# Patient Record
Sex: Female | Born: 1960 | Race: White | Hispanic: No | Marital: Married | Smoking: Never smoker
Health system: Southern US, Community
[De-identification: ages and names within clinical notes are randomized; demographics above are authoritative.]

## PROBLEM LIST (undated history)

## (undated) DIAGNOSIS — M1990 Unspecified osteoarthritis, unspecified site: Secondary | ICD-10-CM

## (undated) DIAGNOSIS — I10 Essential (primary) hypertension: Secondary | ICD-10-CM

## (undated) HISTORY — PX: TONSILLECTOMY: SUR1361

## (undated) HISTORY — DX: Unspecified osteoarthritis, unspecified site: M19.90

---

## 2005-11-08 HISTORY — PX: CERVICAL ABLATION: SHX5771

## 2015-04-03 ENCOUNTER — Other Ambulatory Visit: Payer: Self-pay

## 2015-04-03 ENCOUNTER — Emergency Department
Admission: EM | Admit: 2015-04-03 | Discharge: 2015-04-03 | Disposition: A | Discharge status: Home / Self Care | Payer: BLUE CROSS/BLUE SHIELD | Attending: Emergency Medicine | Admitting: Emergency Medicine

## 2015-04-03 DIAGNOSIS — R51 Headache: Secondary | ICD-10-CM | POA: Insufficient documentation

## 2015-04-03 DIAGNOSIS — Z79899 Other long term (current) drug therapy: Secondary | ICD-10-CM | POA: Diagnosis not present

## 2015-04-03 DIAGNOSIS — I10 Essential (primary) hypertension: Secondary | ICD-10-CM | POA: Diagnosis not present

## 2015-04-03 DIAGNOSIS — R040 Epistaxis: Secondary | ICD-10-CM

## 2015-04-03 LAB — BASIC METABOLIC PANEL
Anion gap: 8 (ref 5–15)
BUN: 13 mg/dL (ref 6–20)
CHLORIDE: 104 mmol/L (ref 101–111)
CO2: 29 mmol/L (ref 22–32)
Calcium: 9.6 mg/dL (ref 8.9–10.3)
Creatinine, Ser: 0.67 mg/dL (ref 0.44–1.00)
GFR calc Af Amer: >60 mL/min (ref >60)
GFR calc non Af Amer: >60 mL/min (ref >60)
Glucose, Bld: 96 mg/dL (ref 65–99)
Potassium: 3.9 mmol/L (ref 3.5–5.1)
SODIUM: 141 mmol/L (ref 135–145)

## 2015-04-03 LAB — CBC
HCT: 40.1 % (ref 35.0–47.0)
Hemoglobin: 13.4 g/dL (ref 12.0–16.0)
MCH: 30.6 pg (ref 26.0–34.0)
MCHC: 33.4 g/dL (ref 32.0–36.0)
MCV: 91.7 fL (ref 80.0–100.0)
PLATELETS: 260 10*3/uL (ref 150–440)
RBC: 4.37 MIL/uL (ref 3.80–5.20)
RDW: 13 % (ref 11.5–14.5)
WBC: 5.2 10*3/uL (ref 3.6–11.0)

## 2015-04-03 LAB — PROTIME-INR
INR: 0.98
Prothrombin Time: 13.2 seconds (ref 11.4–15.0)

## 2015-04-03 LAB — TROPONIN I

## 2015-04-03 MED ORDER — HYDROCHLOROTHIAZIDE 25 MG PO TABS: 25.0000 mg | ORAL_TABLET | Freq: Every day | ORAL | Status: AC

## 2015-04-03 MED ORDER — HYDROCHLOROTHIAZIDE 25 MG PO TABS: 25.0000 mg | ORAL_TABLET | Freq: Every day | ORAL | Status: DC

## 2015-04-03 MED ORDER — HYDROCHLOROTHIAZIDE 12.5 MG PO CAPS
ORAL_CAPSULE | ORAL | Status: AC
  Administered 2015-04-03: 25 mg via ORAL
  Filled 2015-04-03: qty 2

## 2015-04-03 NOTE — ED Provider Notes (Signed)
EKG: Normal sinus rhythm normal axis normal intervals no evidence of her partially or acute infarction, rate is 80.   Alexis Newport, MD 04/03/15 [phone]

## 2015-04-03 NOTE — ED Notes (Addendum)
Patient with a hx of high bp. Patient has been off htn meds for approx 18 months due to insurance reasons. States that she has been having nose bleeds so she checked her BP at home. 188/99 & 184/101 at home. 178/116 at urgent care. Denies headache or blurred vision at this time. Denies experiencing any chest pain or shortness of breath

## 2015-04-03 NOTE — ED Provider Notes (Signed)
[facility] Emergency Department Provider Note  ____________________________________________  Time seen: Approximately 2:53 PM  I have reviewed the triage vital signs and the nursing notes.   HISTORY  Chief Complaint Hypertension   HPI Alexis Ruiz is a 54 y.o. female presents here for concern of high blood pressure. Patient states that she has a history of high blood pressure however 18 months ago stopped taking medications because of insurance issues. States this morning she had a mild headache and she checked her blood pressure at home and that blood pressure was 188/99. Patient states that she has an appointment with her primary care physician tomorrow for follow-up but stated that she wanted to go ahead and have her blood pressure checked. Reports in the past she took 12.5 mg HCTZ daily which worked well for her and controlled her blood pressure. Presents to the ER with a request every starting blood pressure medication. Denies pain or complaints at this time.  States over the last 1-2 weeks she has had a total of 3 nosebleeds. Patient states that these are nontraumatic however noticed that a small amount of blood but quickly resolved with direct pressure. Patient states she has also had some congestion with increased sneezing and blowing of her nose. States this afternoon blowing her nose she had a brief nose bleed (less than 5 minutes) from left nare, that resolved after direct pressure. Patient reports occasional headaches. Denies consistent headaches. Reports some increased stress recently.   Denies chest pain or shortness of breath. Denies dizziness, vision changes, headache, injury, nausea, vomiting, diarrhea, abdominal pain.     No past medical history on file.  There are no active problems to display for this patient.   No past surgical history on file.  No current outpatient prescriptions on file.  Allergies Review of patient's allergies  indicates no known allergies.  No family history on file.  Social History History  Substance Use Topics  . Smoking status: Not on file  . Smokeless tobacco: Not on file  . Alcohol Use: Not on file    Review of Systems Constitutional: No fever/chills Eyes: No visual changes. ENT: No sore throat. Nose bleeds as above.  Cardiovascular: Denies chest pain. Respiratory: Denies shortness of breath. Gastrointestinal: No abdominal pain.  No nausea, no vomiting.  No diarrhea.  No constipation. Genitourinary: Negative for dysuria. Musculoskeletal: Negative for back pain. Skin: Negative for rash. Neurological: Negative for headaches, focal weakness or numbness.  10-point ROS otherwise negative.  ____________________________________________   PHYSICAL EXAM:  VITAL SIGNS: ED Triage Vitals  Enc Vitals Group     BP 04/03/15 1351 176/107 mmHg     Pulse Rate 04/03/15 1351 85     Resp 04/03/15 1351 16     Temp 04/03/15 1351 98.3 F (36.8 C)     Temp Source 04/03/15 1351 Oral     SpO2 04/03/15 1351 100 %     Weight 04/03/15 1351 191 lb (86.637 kg)     Height 04/03/15 1351 5\' 8"  (1.727 m)     Head Cir --      Peak Flow --      Pain Score --      Pain Loc --      Pain Edu? --      Excl. in [place]? --   Blood pressure 150/87, pulse 72, temperature 98.3 F (36.8 C), temperature source Oral, resp. rate 16, height 5\' 8"  (1.727 m), weight 191 lb (86.637 kg), SpO2 100 %.  Constitutional:  Alert and oriented. Well appearing and in no acute distress. Eyes: Conjunctivae are normal. PERRL. EOMI. Head: Atraumatic. Nose: No congestion/rhinnorhea. Left nare with anterior scant amount dried blood. No active bleeding. Nontender. Bilateral nares patent. No other abnormality visualized.  Mouth/Throat: Mucous membranes are moist.  Oropharynx non-erythematous. Neck: No stridor.  No cervical spine tenderness to palpation. Hematological/Lymphatic/Immunilogical: No cervical lymphadenopathy. Cardiovascular:  Normal rate, regular rhythm. Grossly normal heart sounds.  Good peripheral circulation. Respiratory: Normal respiratory effort.  No retractions. Lungs CTAB. Gastrointestinal: Soft and nontender. No distention. No abdominal bruits. No CVA tenderness. Musculoskeletal: No lower extremity tenderness nor edema.  No joint effusions. Neurologic:  Normal speech and language. No gross focal neurologic deficits are appreciated. Speech is normal. No gait instability. Skin:  Skin is warm, dry and intact. No rash noted. Psychiatric: Mood and affect are normal. Speech and behavior are normal.  ____________________________________________   LABS (all labs ordered are listed, but only abnormal results are displayed)  Labs Reviewed  CBC  BASIC METABOLIC PANEL  TROPONIN I  PROTIME-INR   ____________________________________________  EKG  See Dr Jimmye Norman interpretation ____________________________________________   ____________________________________________   INITIAL IMPRESSION / ASSESSMENT AND PLAN / ED COURSE  Pertinent labs & imaging results that were available during my care of the patient were reviewed by me and considered in my medical decision making (see chart for details).    no acute distress. Very well-appearing patient. Denies complaints at this time. However reports for evaluation of blood pressure and for the request of restarting her blood pressure medication. Patient with history of high blood pressure that was managed with 12.5 mg of HCTZ. Patient has follow-up with her primary care physician tomorrow. Will start patient on 25 mg of daily HCTZ and for patient to have close follow-up with primary care physician. EKG in the ER was normal sinus rhythm. Lab work unremarkable including a negative troponin as well as normal range PT/INR. Suspect intermittent nosebleeds related to recent sinus irritation with increased sneezing with elevated blood pressure being possible contributing  factor.  Patient follow up primary care physician tomorrow as scheduled. Blood pressure improved in ER.  Discussed very strict follow-up and return parameters. Patient and spouse agree to plan. ____________________________________________   FINAL CLINICAL IMPRESSION(S) / ED DIAGNOSES  Final diagnoses:  Essential hypertension  Epistaxis  Resolved epistaxis   Marylene Land, NP 04/03/15 Overton Yao, MD 04/05/15 [phone]

## 2015-04-03 NOTE — Discharge Instructions (Signed)
Take medication as prescribed. Eat a healthy diet. Avoid increased sinus irritation.Rest.   Follow-up with the primary care physician tomorrow as scheduled.  Return to the ER immediately for chest pain, shortness of breath, dizziness, headaches, vision changes, new or worsening concerns.  Hypertension Hypertension, commonly called high blood pressure, is when the force of blood pumping through your arteries is too strong. Your arteries are the blood vessels that carry blood from your heart throughout your body. A blood pressure reading consists of a higher number over a lower number, such as 110/72. The higher number (systolic) is the pressure inside your arteries when your heart pumps. The lower number (diastolic) is the pressure inside your arteries when your heart relaxes. Ideally you want your blood pressure below 120/80. Hypertension forces your heart to work harder to pump blood. Your arteries may become narrow or stiff. Having hypertension puts you at risk for heart disease, stroke, and other problems.  RISK FACTORS Some risk factors for high blood pressure are controllable. Others are not.  Risk factors you cannot control include:   Race. You may be at higher risk if you are African American.  Age. Risk increases with age.  Gender. Men are at higher risk than women before age 19 years. After age 24, women are at higher risk than men. Risk factors you can control include:  Not getting enough exercise or physical activity.  Being overweight.  Getting too much fat, sugar, calories, or salt in your diet.  Drinking too much alcohol. SIGNS AND SYMPTOMS Hypertension does not usually cause signs or symptoms. Extremely high blood pressure (hypertensive crisis) may cause headache, anxiety, shortness of breath, and nosebleed. DIAGNOSIS  To check if you have hypertension, your health care provider will measure your blood pressure while you are seated, with your arm held at the level of your  heart. It should be measured at least twice using the same arm. Certain conditions can cause a difference in blood pressure between your right and left arms. A blood pressure reading that is higher than normal on one occasion does not mean that you need treatment. If one blood pressure reading is high, ask your health care provider about having it checked again. TREATMENT  Treating high blood pressure includes making lifestyle changes and possibly taking medicine. Living a healthy lifestyle can help lower high blood pressure. You may need to change some of your habits. Lifestyle changes may include:  Following the DASH diet. This diet is high in fruits, vegetables, and whole grains. It is low in salt, red meat, and added sugars.  Getting at least 2 hours of brisk physical activity every week.  Losing weight if necessary.  Not smoking.  Limiting alcoholic beverages.  Learning ways to reduce stress. If lifestyle changes are not enough to get your blood pressure under control, your health care provider may prescribe medicine. You may need to take more than one. Work closely with your health care provider to understand the risks and benefits. HOME CARE INSTRUCTIONS  Have your blood pressure rechecked as directed by your health care provider.   Take medicines only as directed by your health care provider. Follow the directions carefully. Blood pressure medicines must be taken as prescribed. The medicine does not work as well when you skip doses. Skipping doses also puts you at risk for problems.   Do not smoke.   Monitor your blood pressure at home as directed by your health care provider. SEEK MEDICAL CARE IF:   You  think you are having a reaction to medicines taken.  You have recurrent headaches or feel dizzy.  You have swelling in your ankles.  You have trouble with your vision. SEEK IMMEDIATE MEDICAL CARE IF:  You develop a severe headache or confusion.  You have unusual  weakness, numbness, or feel faint.  You have severe chest or abdominal pain.  You vomit repeatedly.  You have trouble breathing. MAKE SURE YOU:   Understand these instructions.  Will watch your condition.  Will get help right away if you are not doing well or get worse. Document Released: 10/25/2005 Document Revised: 03/11/2014 Document Reviewed: 08/17/2013 [facility] Patient Information 2015 [place], [place]. This information is not intended to replace advice given to you by your health care provider. Make sure you discuss any questions you have with your health care provider.

## 2015-05-06 ENCOUNTER — Other Ambulatory Visit: Payer: Self-pay | Admitting: Family Medicine

## 2015-05-06 DIAGNOSIS — Z1231 Encounter for screening mammogram for malignant neoplasm of breast: Secondary | ICD-10-CM

## 2015-06-12 ENCOUNTER — Other Ambulatory Visit: Payer: Self-pay | Admitting: Family Medicine

## 2015-06-12 DIAGNOSIS — M5442 Lumbago with sciatica, left side: Secondary | ICD-10-CM

## 2015-06-19 ENCOUNTER — Ambulatory Visit: Payer: BLUE CROSS/BLUE SHIELD

## 2015-08-28 HISTORY — PX: LUMBAR LAMINECTOMY: SHX95

## 2016-05-04 ENCOUNTER — Other Ambulatory Visit: Payer: Self-pay | Admitting: Family Medicine

## 2016-05-04 DIAGNOSIS — Z1231 Encounter for screening mammogram for malignant neoplasm of breast: Secondary | ICD-10-CM

## 2016-06-11 ENCOUNTER — Other Ambulatory Visit: Payer: Self-pay | Admitting: Family Medicine

## 2016-06-11 ENCOUNTER — Ambulatory Visit
Admission: RE | Admit: 2016-06-11 | Discharge: 2016-06-11 | Disposition: A | Discharge status: Home / Self Care | Payer: BLUE CROSS/BLUE SHIELD | Source: Ambulatory Visit | Attending: Family Medicine | Admitting: Family Medicine

## 2016-06-11 DIAGNOSIS — Z1231 Encounter for screening mammogram for malignant neoplasm of breast: Secondary | ICD-10-CM

## 2016-08-06 DIAGNOSIS — K8050 Calculus of bile duct without cholangitis or cholecystitis without obstruction: Secondary | ICD-10-CM | POA: Diagnosis not present

## 2016-08-06 DIAGNOSIS — R1013 Epigastric pain: Secondary | ICD-10-CM | POA: Diagnosis present

## 2016-08-06 DIAGNOSIS — K859 Acute pancreatitis without necrosis or infection, unspecified: Secondary | ICD-10-CM | POA: Diagnosis not present

## 2016-08-06 DIAGNOSIS — I10 Essential (primary) hypertension: Secondary | ICD-10-CM | POA: Diagnosis not present

## 2016-08-06 LAB — COMPREHENSIVE METABOLIC PANEL
ALBUMIN: 4.4 g/dL (ref 3.5–5.0)
ALK PHOS: 65 U/L (ref 38–126)
ALT: 45 U/L (ref 14–54)
AST: 95 U/L — ABNORMAL HIGH (ref 15–41)
Anion gap: 3 — ABNORMAL LOW (ref 5–15)
BUN: 26 mg/dL — ABNORMAL HIGH (ref 6–20)
CALCIUM: 9.6 mg/dL (ref 8.9–10.3)
CO2: 33 mmol/L — ABNORMAL HIGH (ref 22–32)
CREATININE: 0.87 mg/dL (ref 0.44–1.00)
Chloride: 105 mmol/L (ref 101–111)
GFR calc Af Amer: >60 mL/min (ref >60)
GFR calc non Af Amer: >60 mL/min (ref >60)
GLUCOSE: 104 mg/dL — AB (ref 65–99)
Potassium: 3.5 mmol/L (ref 3.5–5.1)
SODIUM: 141 mmol/L (ref 135–145)
Total Bilirubin: 0.6 mg/dL (ref 0.3–1.2)
Total Protein: 7.6 g/dL (ref 6.5–8.1)

## 2016-08-06 LAB — CBC
HCT: 37.2 % (ref 35.0–47.0)
HEMOGLOBIN: 12.9 g/dL (ref 12.0–16.0)
MCH: 31 pg (ref 26.0–34.0)
MCHC: 34.6 g/dL (ref 32.0–36.0)
MCV: 89.5 fL (ref 80.0–100.0)
PLATELETS: 240 10*3/uL (ref 150–440)
RBC: 4.15 MIL/uL (ref 3.80–5.20)
RDW: 13.4 % (ref 11.5–14.5)
WBC: 5.4 10*3/uL (ref 3.6–11.0)

## 2016-08-06 LAB — LIPASE, BLOOD: Lipase: 197 U/L — ABNORMAL HIGH (ref 11–51)

## 2016-08-06 NOTE — ED Triage Notes (Signed)
Patient states "I think I might have had a gallbladder attack."  Patient reports she had eaten shortly prior to pain starting.  Reports pain was epigastric and radiated to her back.

## 2016-08-07 ENCOUNTER — Encounter: Payer: Self-pay | Admitting: Emergency Medicine

## 2016-08-07 ENCOUNTER — Emergency Department: Payer: BLUE CROSS/BLUE SHIELD

## 2016-08-07 ENCOUNTER — Emergency Department
Admission: EM | Admit: 2016-08-07 | Discharge: 2016-08-07 | Disposition: A | Discharge status: Home / Self Care | Payer: BLUE CROSS/BLUE SHIELD | Attending: Emergency Medicine | Admitting: Emergency Medicine

## 2016-08-07 DIAGNOSIS — K859 Acute pancreatitis, unspecified: Secondary | ICD-10-CM

## 2016-08-07 DIAGNOSIS — K8050 Calculus of bile duct without cholangitis or cholecystitis without obstruction: Secondary | ICD-10-CM

## 2016-08-07 HISTORY — DX: Essential (primary) hypertension: I10

## 2016-08-07 LAB — URINALYSIS COMPLETE WITH MICROSCOPIC (ARMC ONLY)
BILIRUBIN URINE: NEGATIVE
Bacteria, UA: NONE SEEN
GLUCOSE, UA: NEGATIVE mg/dL
HGB URINE DIPSTICK: NEGATIVE
KETONES UR: NEGATIVE mg/dL
LEUKOCYTES UA: NEGATIVE
Nitrite: NEGATIVE
Protein, ur: NEGATIVE mg/dL
SQUAMOUS EPITHELIAL / LPF: NONE SEEN
Specific Gravity, Urine: 1.025 (ref 1.005–1.030)
pH: 6 (ref 5.0–8.0)

## 2016-08-07 MED ORDER — HYDROCODONE-ACETAMINOPHEN 5-325 MG PO TABS: 1.0000 | ORAL_TABLET | ORAL | 0 refills | Status: DC | PRN

## 2016-08-07 MED ORDER — DOCUSATE SODIUM 100 MG PO CAPS: ORAL_CAPSULE | ORAL | 0 refills | Status: AC

## 2016-08-07 MED ORDER — ONDANSETRON 4 MG PO TBDP: ORAL_TABLET | ORAL | 0 refills | Status: DC

## 2016-08-07 NOTE — Discharge Instructions (Signed)
You have been seen in the Emergency Department (ED) for abdominal pain.  Your evaluation suggests that your pain is caused by gallstones.  Fortunately you do not need immediate surgery at this time, but it is important that you follow up with a surgeon as an outpatient; typically surgical removal of the gallbladder is the only thing that will definitively fix your issue.  Read through the included information about a bland diet, and use any prescribed medications as instructed.  Avoid smoking and alcohol use.  Of note, your lipase was elevated slightly today which indicates that he may have a mild degree of pancreatitis, but given that you are not having nausea, vomiting, or persistent abdominal pain, it is reasonable to go home and follow-up as an outpatient.  Please do read through all the included information, however, for additional management recommendations.  Avoid spicy and fatty foods.  Please follow up as instructed above regarding today?s emergent visit and the symptoms that are bothering you.  Take Norco as prescribed for pain. Do not drink alcohol, drive or participate in any other potentially dangerous activities while taking this medication as it may make you sleepy. Do not take this medication with any other sedating medications, either prescription or over-the-counter. If you were prescribed Percocet or Vicodin, do not take these with acetaminophen (Tylenol) as it is already contained within these medications.   This medication is an opiate (or narcotic) pain medication and can be habit forming.  Use it as little as possible to achieve adequate pain control.  Do not use or use it with extreme caution if you have a history of opiate abuse or dependence.  If you are on a pain contract with your primary care doctor or a pain specialist, be sure to let them know you were prescribed this medication today from the [facility].  This medication is intended for your use only  - do not give any to anyone else and keep it in a secure place where nobody else, especially children, have access to it.  It will also cause or worsen constipation, so you may want to consider taking an over-the-counter stool softener while you are taking this medication.  Return to the ED if your abdominal pain worsens or fails to improve, you develop bloody vomiting, bloody diarrhea, you are unable to tolerate fluids due to vomiting, fever greater than 101, or other symptoms that concern you.

## 2016-08-07 NOTE — ED Provider Notes (Signed)
[facility] Emergency Department Provider Note  ____________________________________________   First MD Initiated Contact with Patient 08/07/16 0100     (approximate)  I have reviewed the triage vital signs and the nursing notes.   HISTORY  Chief Complaint Abdominal Pain    HPI Alexis Ruiz is a 55 y.o. female who presents for evaluation of an episode of severe epigastric pain radiating through to her back that occurred about 30 minutes after she ate a spicy dinner.  She reports that she has never had a diagnosis of gallstones in the past but all of her siblings have and this felt just like what she was told there symptoms felt like.  She was asymptomatic but then had acute onset of severe sharp and burning pain in her epigastrium and right upper quadrant but nothing made better or worse but it only lasted about 15-20 minutes.  It was accompanied with nausea but no vomiting.  She denies fever/chills, chest pain, shortness of breath, dysuria.He is currently completely asymptomatic.  She reports that she does not smoke.  She drinks about 4-5 times a week typically only 1 glass of wine or one cocktail at a time.  She did have some alcohol earlier tonight, just 1 drink.  She has not had any new medications recently.   Past Medical History:  Diagnosis Date  . Hypertension     There are no active problems to display for this patient.   History reviewed. No pertinent surgical history.  Prior to Admission medications   Medication Sig Start Date End Date Taking? Authorizing Provider  docusate sodium (COLACE) 100 MG capsule Take 1 tablet once or twice daily as needed for constipation while taking narcotic pain medicine 08/07/16   Hinda Kehr, MD  hydrochlorothiazide (HYDRODIURIL) 25 MG tablet Take 1 tablet (25 mg total) by mouth daily. 04/03/15   Marylene Land, NP  HYDROcodone-acetaminophen (NORCO/VICODIN) 5-325 MG tablet Take 1-2 tablets by mouth every 4  (four) hours as needed for moderate pain. 08/07/16   Hinda Kehr, MD  ondansetron (ZOFRAN ODT) 4 MG disintegrating tablet Allow 1-2 tablets to dissolve in your mouth every 8 hours as needed for nausea/vomiting 08/07/16   Hinda Kehr, MD    Allergies Review of patient's allergies indicates no known allergies.  Family History  Problem Relation Age of Onset  . Breast cancer Neg Hx     Social History Social History  Substance Use Topics  . Smoking status: Never Smoker  . Smokeless tobacco: Never Used  . Alcohol use 2.4 oz/week    4 Glasses of wine per week    Review of Systems Constitutional: No fever/chills Eyes: No visual changes. ENT: No sore throat. Cardiovascular: Denies chest pain. Respiratory: Denies shortness of breath. Gastrointestinal: Right upper quadrant and epigastric pain with nausea, no vomiting, now resolved Genitourinary: Negative for dysuria. Musculoskeletal: Negative for back pain. Skin: Negative for rash. Neurological: Negative for headaches, focal weakness or numbness.  10-point ROS otherwise negative.  ____________________________________________   PHYSICAL EXAM:  VITAL SIGNS: ED Triage Vitals [08/06/16 2106]  Enc Vitals Group     BP (!) 162/87     Pulse Rate 77     Resp 20     Temp 98.1 F (36.7 C)     Temp Source Oral     SpO2 100 %     Weight 195 lb (88.5 kg)     Height 5\' 8"  (1.727 m)     Head Circumference  Peak Flow      Pain Score      Pain Loc      Pain Edu?      Excl. in [place]?     Constitutional: Alert and oriented. Well appearing and in no acute distress. Eyes: Conjunctivae are normal. PERRL. EOMI. Head: Atraumatic. Nose: No congestion/rhinnorhea. Mouth/Throat: Mucous membranes are moist.  Oropharynx non-erythematous. Neck: No stridor.  No meningeal signs.   Cardiovascular: Normal rate, regular rhythm. Good peripheral circulation. Grossly normal heart sounds. Respiratory: Normal respiratory effort.  No retractions. Lungs  CTAB. Gastrointestinal: Soft With minimal tenderness to palpation of the right upper quadrant, no significant Murphy sign, no right lower or left lower quadrant tenderness.  Down or guarding. Musculoskeletal: No lower extremity tenderness nor edema. No gross deformities of extremities. Neurologic:  Normal speech and language. No gross focal neurologic deficits are appreciated.  Skin:  Skin is warm, dry and intact. No rash noted. Psychiatric: Mood and affect are normal. Speech and behavior are normal.  ____________________________________________   LABS (all labs ordered are listed, but only abnormal results are displayed)  Labs Reviewed  LIPASE, BLOOD - Abnormal; Notable for the following:       Result Value   Lipase 197 (*)    All other components within normal limits  COMPREHENSIVE METABOLIC PANEL - Abnormal; Notable for the following:    CO2 33 (*)    Glucose, Bld 104 (*)    BUN 26 (*)    AST 95 (*)    Anion gap 3 (*)    All other components within normal limits  URINALYSIS COMPLETEWITH MICROSCOPIC ([facility] ONLY) - Abnormal; Notable for the following:    Color, Urine YELLOW (*)    APPearance CLEAR (*)    All other components within normal limits  CBC   ____________________________________________  EKG  ED ECG REPORT I, Mackson Botz, the attending physician, personally viewed and interpreted this ECG.  Date: 08/07/2016 EKG Time: 00:46 Rate: 72 Rhythm: normal sinus rhythm QRS Axis: normal Intervals: normal ST/T Wave abnormalities: normal Conduction Disturbances: none Narrative Interpretation: RSR' pattern, Non-specific ST segment / T-wave changes, but no evidence of acute ischemia.  ____________________________________________  RADIOLOGY   US Abdomen Limited Ruq  Result Date: 08/07/2016 CLINICAL DATA:  Acute onset of severe right upper quadrant abdominal pain and epigastric pain. Elevated lipase. Initial encounter. EXAM: US ABDOMEN LIMITED - RIGHT UPPER QUADRANT  COMPARISON:  None. FINDINGS: Gallbladder: Stones and sludge are noted filling the gallbladder. Stones measure up to 3.1 cm in size. No gallbladder wall thickening or pericholecystic fluid is seen. No ultrasonographic Murphy's sign is elicited. Common bile duct: Diameter: 0.4 cm, within normal limits in caliber. Liver: No focal lesion identified. Within normal limits in parenchymal echogenicity. IMPRESSION: Stones and sludge noted filling the gallbladder. No evidence for obstruction or cholecystitis. Electronically Signed   By: Garald Balding M.D.   On: 08/07/2016 03:09    ____________________________________________   PROCEDURES  Procedure(s) performed:   Procedures   Critical Care performed: No ____________________________________________   INITIAL IMPRESSION / ASSESSMENT AND PLAN / ED COURSE  Pertinent labs & imaging results that were available during my care of the patient were reviewed by me and considered in my medical decision making (see chart for details).  Liver enzymes are unremarkable but she does have an elevation of her lipase.  However at this point she is completely asymptomatic.  Choledocholithiasis is the most likely culprit.  We will evaluate with an ultrasound.  Given the description  of the symptoms we must also consider ACS or even aortic pathology, but given the elevated lipase I believe they choledocholithiasis is much more likely and we will investigate further with ultrasound.   Clinical Course  Value Comment By Time  US Abdomen Limited RUQ Sludge and gallstones but no evidence of cholecystitis or choledocholithiasis with a normal caliber common bile duct.  The patient remains asymptomatic, very minimally tender to palpation, no nausea and no vomiting.  She is comfortable with the plan to go home and follow-up as an outpatient.  I am giving her information about both pancreatitis and biliary disease and I gave her strict return precautions if she were to develop a  fever or more serious or severe symptoms.  She understands and agrees with the plan. Hinda Kehr, MD 09/30 [id number]    ____________________________________________  FINAL CLINICAL IMPRESSION(S) / ED DIAGNOSES  Final diagnoses:  Biliary colic  Acute pancreatitis, unspecified pancreatitis type     MEDICATIONS GIVEN DURING THIS VISIT:  Medications - No data to display   NEW OUTPATIENT MEDICATIONS STARTED DURING THIS VISIT:  New Prescriptions   DOCUSATE SODIUM (COLACE) 100 MG CAPSULE    Take 1 tablet once or twice daily as needed for constipation while taking narcotic pain medicine   HYDROCODONE-ACETAMINOPHEN (NORCO/VICODIN) 5-325 MG TABLET    Take 1-2 tablets by mouth every 4 (four) hours as needed for moderate pain.   ONDANSETRON (ZOFRAN ODT) 4 MG DISINTEGRATING TABLET    Allow 1-2 tablets to dissolve in your mouth every 8 hours as needed for nausea/vomiting    Modified Medications   No medications on file    Discontinued Medications   No medications on file     Note:  This document was prepared using Dragon voice recognition software and may include unintentional dictation errors.    Hinda Kehr, MD 08/07/16 [phone]

## 2016-08-10 ENCOUNTER — Ambulatory Visit: Payer: Self-pay | Admitting: Surgery

## 2016-08-10 ENCOUNTER — Ambulatory Visit (INDEPENDENT_AMBULATORY_CARE_PROVIDER_SITE_OTHER): Payer: BLUE CROSS/BLUE SHIELD | Admitting: Surgery

## 2016-08-10 ENCOUNTER — Other Ambulatory Visit
Admission: RE | Admit: 2016-08-10 | Discharge: 2016-08-10 | Disposition: A | Discharge status: Home / Self Care | Payer: BLUE CROSS/BLUE SHIELD | Source: Ambulatory Visit | Attending: Surgery | Admitting: Surgery

## 2016-08-10 ENCOUNTER — Encounter: Payer: Self-pay | Admitting: Surgery

## 2016-08-10 VITALS — BP 160/92 | HR 66 | Temp 98.5°F | Wt 194.0 lb

## 2016-08-10 DIAGNOSIS — K8020 Calculus of gallbladder without cholecystitis without obstruction: Secondary | ICD-10-CM

## 2016-08-10 DIAGNOSIS — R1013 Epigastric pain: Secondary | ICD-10-CM | POA: Diagnosis not present

## 2016-08-10 LAB — COMPREHENSIVE METABOLIC PANEL
ALT: 22 U/L (ref 14–54)
AST: 24 U/L (ref 15–41)
Albumin: 4.7 g/dL (ref 3.5–5.0)
Alkaline Phosphatase: 63 U/L (ref 38–126)
Anion gap: 9 (ref 5–15)
BILIRUBIN TOTAL: 0.5 mg/dL (ref 0.3–1.2)
BUN: 19 mg/dL (ref 6–20)
CHLORIDE: 98 mmol/L — AB (ref 101–111)
CO2: 29 mmol/L (ref 22–32)
CREATININE: 0.71 mg/dL (ref 0.44–1.00)
Calcium: 9.5 mg/dL (ref 8.9–10.3)
GFR calc Af Amer: >60 mL/min (ref >60)
GLUCOSE: 96 mg/dL (ref 65–99)
POTASSIUM: 4 mmol/L (ref 3.5–5.1)
Sodium: 136 mmol/L (ref 135–145)
Total Protein: 7.6 g/dL (ref 6.5–8.1)

## 2016-08-10 LAB — CBC WITH DIFFERENTIAL/PLATELET
Basophils Absolute: 0 10*3/uL (ref 0–0.1)
Basophils Relative: 1 %
Eosinophils Absolute: 0.2 10*3/uL (ref 0–0.7)
Eosinophils Relative: 5 %
HEMATOCRIT: 40.8 % (ref 35.0–47.0)
Hemoglobin: 13.6 g/dL (ref 12.0–16.0)
LYMPHS PCT: 23 %
Lymphs Abs: 1.2 10*3/uL (ref 1.0–3.6)
MCH: 30 pg (ref 26.0–34.0)
MCHC: 33.2 g/dL (ref 32.0–36.0)
MCV: 90.3 fL (ref 80.0–100.0)
Monocytes Absolute: 0.4 10*3/uL (ref 0.2–0.9)
Monocytes Relative: 7 %
Neutro Abs: 3.3 10*3/uL (ref 1.4–6.5)
Neutrophils Relative %: 64 %
PLATELETS: 254 10*3/uL (ref 150–440)
RBC: 4.52 MIL/uL (ref 3.80–5.20)
RDW: 13.6 % (ref 11.5–14.5)
WBC: 5.2 10*3/uL (ref 3.6–11.0)

## 2016-08-10 LAB — LIPASE, BLOOD: LIPASE: 45 U/L (ref 11–51)

## 2016-08-10 NOTE — Progress Notes (Signed)
  Surgical Consultation  08/10/2016  Alexis Ruiz is an 55 y.o. female.   CC: Right upper quadrant pain  HPI: This patient with recurrent right upper quadrant pain and recent elevated lipase and LFTs.  In ED with nausea and pain. Pain resolved. Patient states that she had right upper quadrant pain and pain in her epigastrium going straight through to her back. She denied jaundice or dark urine. Her pain lasted 15 minutes and had resolved prior to her being in the emergency room. Steak and no pain medication since then this was a single episode that lasted 15 minutes and none since.  Past Medical History:  Diagnosis Date  . Hypertension     History reviewed. No pertinent surgical history.  Family History  Problem Relation Age of Onset  . Breast cancer Neg Hx     Social History:  reports that she has never smoked. She has never used smokeless tobacco. She reports that she drinks about 2.4 oz of alcohol per week . She reports that she does not use drugs.  Allergies: No Known Allergies  Medications reviewed.   Review of Systems:   Review of Systems  Constitutional: Negative for chills and fever.  HENT: Negative.   Eyes: Negative.   Respiratory: Negative.   Cardiovascular: Negative.   Gastrointestinal: Positive for abdominal pain and nausea. Negative for blood in stool, constipation, diarrhea, heartburn, melena and vomiting.  Genitourinary: Negative.   Musculoskeletal: Negative.   Skin: Negative.   Neurological: Negative.   Endo/Heme/Allergies: Negative.   Psychiatric/Behavioral: Negative.      Physical Exam:  There were no vitals taken for this visit.  Physical Exam  Constitutional: She is oriented to person, place, and time and well-developed, well-nourished, and in no distress. No distress.  HENT:  Head: Normocephalic and atraumatic.  Eyes: Pupils are equal, round, and reactive to light. Right eye exhibits no discharge. Left eye exhibits no discharge. No  scleral icterus.  Neck: Normal range of motion.  Cardiovascular: Normal rate, regular rhythm and normal heart sounds.   Pulmonary/Chest: Effort normal and breath sounds normal. No respiratory distress. She has no wheezes. She has no rales.  Abdominal: Soft. She exhibits no distension. There is no tenderness. There is no rebound and no guarding.  Musculoskeletal: Normal range of motion. She exhibits no edema or tenderness.  Lymphadenopathy:    She has no cervical adenopathy.  Neurological: She is alert and oriented to person, place, and time.  Skin: Skin is warm and dry. She is not diaphoretic. No erythema.  Psychiatric: Mood and affect normal.  Vitals reviewed.     No results found for this or any previous visit (from the past 48 hour(s)). No results found.  Assessment/Plan:  This patient with single episode of right upper quadrant pain associated with fatty food intolerance and that pain radiated straight through to her back. Her ultrasound shows stones and sludge but her liver function tests were slightly elevated and she had a slightly elevated lipase as well all suggestive of passage of a stone. Clinically she did not sign have signs of a passed stone or choledocholithiasis. My plan would be to recheck liver function tests and lipase and see her back in the office and consider surgical intervention at that time. She understood and agreed with this plan  Florene Glen, MD, FACS

## 2016-08-24 ENCOUNTER — Encounter: Payer: Self-pay | Admitting: Surgery

## 2016-08-24 ENCOUNTER — Ambulatory Visit (INDEPENDENT_AMBULATORY_CARE_PROVIDER_SITE_OTHER): Payer: BLUE CROSS/BLUE SHIELD | Admitting: Surgery

## 2016-08-24 VITALS — BP 168/92 | HR 68 | Temp 97.8°F | Wt 197.0 lb

## 2016-08-24 DIAGNOSIS — K8020 Calculus of gallbladder without cholecystitis without obstruction: Secondary | ICD-10-CM

## 2016-08-24 NOTE — Patient Instructions (Signed)
Please give us a call if you have any questions or concerns. 

## 2016-08-24 NOTE — Progress Notes (Signed)
Outpatient Surgical Follow Up  08/24/2016  Alexis Ruiz is an 55 y.o. female.   CC: Biliary colic  HPI: This a patient with a single episode of biliary colic. It lasted 15 minutes and was gone before she made it into the emergency room proper. I had seen her several weeks ago and asked her to keep track of her diet and any pain that she might have an to follow-up here in the office. Currently she has no pain and has had no further attacks only the single 15 minute attack last month. She has no nausea vomiting fevers or chills she's been eating fatty foods without difficulty and no diarrhea no jaundice or acholic stools. Her husband is with her today.  Past Medical History:  Diagnosis Date  . Arthritis   . Hypertension     Past Surgical History:  Procedure Laterality Date  . CERVICAL ABLATION  2007  . LUMBAR LAMINECTOMY  08/28/2015  . TONSILLECTOMY     as child    Family History  Problem Relation Age of Onset  . Heart disease Father   . Cancer Maternal Grandmother     Bone Cancer  . Breast cancer Neg Hx     Social History:  reports that she has never smoked. She has never used smokeless tobacco. She reports that she drinks alcohol. She reports that she does not use drugs.  Allergies: No Known Allergies  Medications reviewed.   Review of Systems:   Review of Systems  Constitutional: Negative for chills and fever.  HENT: Negative.   Eyes: Negative.   Respiratory: Negative.   Cardiovascular: Negative.   Gastrointestinal: Negative for abdominal pain, blood in stool, constipation, diarrhea, heartburn, nausea and vomiting.  Genitourinary: Negative.   Musculoskeletal: Negative.   Skin: Negative.   Neurological: Negative.   Endo/Heme/Allergies: Negative.   Psychiatric/Behavioral: Negative.      Physical Exam:  BP (!) 168/92   Pulse 68   Temp 97.8 F (36.6 C) (Oral)   Wt 197 lb (89.4 kg)   BMI (P) 29.95 kg/m   Physical Exam  Constitutional: She is  oriented to person, place, and time and well-developed, well-nourished, and in no distress. No distress.  HENT:  Head: Normocephalic and atraumatic.  Eyes: Pupils are equal, round, and reactive to light. Right eye exhibits no discharge. Left eye exhibits no discharge. No scleral icterus.  Neck: Normal range of motion.  Cardiovascular: Normal rate, regular rhythm and normal heart sounds.   Pulmonary/Chest: Effort normal and breath sounds normal. No respiratory distress. She has no wheezes.  Abdominal: Soft. She exhibits no distension. There is no tenderness. There is no rebound and no guarding.  Musculoskeletal: Normal range of motion. She exhibits no edema or tenderness.  Lymphadenopathy:    She has no cervical adenopathy.  Neurological: She is alert and oriented to person, place, and time.  Skin: Skin is warm and dry. No rash noted. She is not diaphoretic. No erythema.  Psychiatric: Mood and affect normal.  Vitals reviewed.     No results found for this or any previous visit (from the past 48 hour(s)). No results found.  Assessment/Plan:  Single episode of biliary colic fairly classic in nature. She has no other symptoms and has not had a single episode since that first and only attack.  I discussed with she and her husband that surgical options are certainly opened in this case because she clearly has gallstones and had a single attack however the risks associated with  surgery far outweigh the benefit associated with alleviating of a single 15 minute episode of pain. I discussed with her the options and the fact that should her symptoms return she can follow up with [place] at any time. She does not travel overseas and has no other comorbidities to suggest the need for any urgent surgery. She will keep in touch and if this returns we can revisit the consideration for surgery.  Florene Glen, MD, FACS

## 2019-01-25 ENCOUNTER — Other Ambulatory Visit: Payer: Self-pay

## 2019-01-25 ENCOUNTER — Other Ambulatory Visit: Payer: Self-pay | Admitting: General Surgery

## 2019-01-25 ENCOUNTER — Ambulatory Visit: Payer: Self-pay | Admitting: General Surgery

## 2019-01-25 ENCOUNTER — Ambulatory Visit
Admission: RE | Admit: 2019-01-25 | Discharge: 2019-01-25 | Disposition: A | Discharge status: Home / Self Care | Payer: BLUE CROSS/BLUE SHIELD | Source: Ambulatory Visit | Attending: General Surgery | Admitting: General Surgery

## 2019-01-25 ENCOUNTER — Observation Stay
Admission: RE | Admit: 2019-01-25 | Discharge: 2019-01-26 | Disposition: A | Discharge status: Home / Self Care | Payer: BLUE CROSS/BLUE SHIELD | Source: Ambulatory Visit | Attending: General Surgery | Admitting: General Surgery

## 2019-01-25 ENCOUNTER — Inpatient Hospital Stay: Payer: BLUE CROSS/BLUE SHIELD | Admitting: Anesthesiology

## 2019-01-25 ENCOUNTER — Encounter
Admission: RE | Disposition: A | Discharge status: Home / Self Care | Payer: Self-pay | Source: Ambulatory Visit | Attending: General Surgery

## 2019-01-25 ENCOUNTER — Encounter: Payer: Self-pay | Admitting: Anesthesiology

## 2019-01-25 DIAGNOSIS — K812 Acute cholecystitis with chronic cholecystitis: Principal | ICD-10-CM | POA: Insufficient documentation

## 2019-01-25 DIAGNOSIS — I10 Essential (primary) hypertension: Secondary | ICD-10-CM | POA: Insufficient documentation

## 2019-01-25 DIAGNOSIS — K810 Acute cholecystitis: Secondary | ICD-10-CM

## 2019-01-25 DIAGNOSIS — Z79899 Other long term (current) drug therapy: Secondary | ICD-10-CM | POA: Diagnosis not present

## 2019-01-25 HISTORY — PX: CHOLECYSTECTOMY: SHX55

## 2019-01-25 LAB — BASIC METABOLIC PANEL
Anion gap: 10 (ref 5–15)
BUN: 24 mg/dL — ABNORMAL HIGH (ref 6–20)
CO2: 28 mmol/L (ref 22–32)
Calcium: 8.6 mg/dL — ABNORMAL LOW (ref 8.9–10.3)
Chloride: 92 mmol/L — ABNORMAL LOW (ref 98–111)
Creatinine, Ser: 1 mg/dL (ref 0.44–1.00)
GFR calc Af Amer: >60 mL/min (ref >60)
GFR calc non Af Amer: >60 mL/min (ref >60)
Glucose, Bld: 163 mg/dL — ABNORMAL HIGH (ref 70–99)
POTASSIUM: 3.6 mmol/L (ref 3.5–5.1)
Sodium: 130 mmol/L — ABNORMAL LOW (ref 135–145)

## 2019-01-25 SURGERY — LAPAROSCOPIC CHOLECYSTECTOMY
Anesthesia: General | Site: Abdomen

## 2019-01-25 MED ORDER — FENTANYL CITRATE (PF) 100 MCG/2ML IJ SOLN
25.0000 ug | INTRAMUSCULAR | Status: DC | PRN
  Administered 2019-01-25 (×4): 25 ug via INTRAVENOUS

## 2019-01-25 MED ORDER — DOCUSATE SODIUM 100 MG PO CAPS: 100.0000 mg | ORAL_CAPSULE | Freq: Every day | ORAL | Status: DC | PRN

## 2019-01-25 MED ORDER — SUCCINYLCHOLINE CHLORIDE 20 MG/ML IJ SOLN
INTRAMUSCULAR | Status: AC
  Filled 2019-01-25: qty 1

## 2019-01-25 MED ORDER — SUGAMMADEX SODIUM 200 MG/2ML IV SOLN
INTRAVENOUS | Status: AC
  Filled 2019-01-25: qty 2

## 2019-01-25 MED ORDER — ONDANSETRON HCL 4 MG/2ML IJ SOLN
INTRAMUSCULAR | Status: AC
  Filled 2019-01-25: qty 2

## 2019-01-25 MED ORDER — CIPROFLOXACIN IN D5W 400 MG/200ML IV SOLN
400.0000 mg | Freq: Two times a day (BID) | INTRAVENOUS | Status: DC
  Administered 2019-01-26: 400 mg via INTRAVENOUS
  Filled 2019-01-25 (×3): qty 200

## 2019-01-25 MED ORDER — MIDAZOLAM HCL 2 MG/2ML IJ SOLN
INTRAMUSCULAR | Status: DC | PRN
  Administered 2019-01-25: 2 mg via INTRAVENOUS

## 2019-01-25 MED ORDER — CEFAZOLIN SODIUM-DEXTROSE 2-4 GM/100ML-% IV SOLN
2.0000 g | INTRAVENOUS | Status: AC
  Administered 2019-01-25: 2 g via INTRAVENOUS

## 2019-01-25 MED ORDER — ACETAMINOPHEN 325 MG PO TABS: 650.0000 mg | ORAL_TABLET | Freq: Four times a day (QID) | ORAL | Status: DC | PRN

## 2019-01-25 MED ORDER — DEXAMETHASONE SODIUM PHOSPHATE 10 MG/ML IJ SOLN
INTRAMUSCULAR | Status: DC | PRN
  Administered 2019-01-25: 10 mg via INTRAVENOUS

## 2019-01-25 MED ORDER — DEXAMETHASONE SODIUM PHOSPHATE 10 MG/ML IJ SOLN
INTRAMUSCULAR | Status: AC
  Filled 2019-01-25: qty 1

## 2019-01-25 MED ORDER — MORPHINE SULFATE (PF) 4 MG/ML IV SOLN: 4.0000 mg | INTRAVENOUS | Status: DC | PRN

## 2019-01-25 MED ORDER — PROPOFOL 10 MG/ML IV BOLUS
INTRAVENOUS | Status: DC | PRN
  Administered 2019-01-25: 200 mg via INTRAVENOUS

## 2019-01-25 MED ORDER — SUCCINYLCHOLINE CHLORIDE 20 MG/ML IJ SOLN
INTRAMUSCULAR | Status: DC | PRN
  Administered 2019-01-25: 100 mg via INTRAVENOUS

## 2019-01-25 MED ORDER — SODIUM CHLORIDE FLUSH 0.9 % IV SOLN
INTRAVENOUS | Status: AC
  Filled 2019-01-25: qty 10

## 2019-01-25 MED ORDER — PROMETHAZINE HCL 25 MG/ML IJ SOLN: 6.2500 mg | INTRAMUSCULAR | Status: DC | PRN

## 2019-01-25 MED ORDER — FAMOTIDINE IN NACL 20-0.9 MG/50ML-% IV SOLN
20.0000 mg | Freq: Two times a day (BID) | INTRAVENOUS | Status: DC
  Administered 2019-01-26: 20 mg via INTRAVENOUS
  Filled 2019-01-25: qty 50

## 2019-01-25 MED ORDER — PROPOFOL 10 MG/ML IV BOLUS
INTRAVENOUS | Status: AC
  Filled 2019-01-25: qty 20

## 2019-01-25 MED ORDER — ACETAMINOPHEN 650 MG RE SUPP: 650.0000 mg | Freq: Four times a day (QID) | RECTAL | Status: DC | PRN

## 2019-01-25 MED ORDER — ONDANSETRON 4 MG PO TBDP: 4.0000 mg | ORAL_TABLET | Freq: Four times a day (QID) | ORAL | Status: DC | PRN

## 2019-01-25 MED ORDER — HYDROCODONE-ACETAMINOPHEN 5-325 MG PO TABS
1.0000 | ORAL_TABLET | ORAL | Status: DC | PRN
  Administered 2019-01-26: 1 via ORAL
  Filled 2019-01-25: qty 1

## 2019-01-25 MED ORDER — ONDANSETRON HCL 4 MG/2ML IJ SOLN
INTRAMUSCULAR | Status: DC | PRN
  Administered 2019-01-25: 4 mg via INTRAVENOUS

## 2019-01-25 MED ORDER — HYDROCHLOROTHIAZIDE 25 MG PO TABS
25.0000 mg | ORAL_TABLET | Freq: Every day | ORAL | Status: DC
  Administered 2019-01-26: 25 mg via ORAL
  Filled 2019-01-25: qty 1

## 2019-01-25 MED ORDER — FENTANYL CITRATE (PF) 100 MCG/2ML IJ SOLN
INTRAMUSCULAR | Status: AC
  Filled 2019-01-25: qty 2

## 2019-01-25 MED ORDER — MIDAZOLAM HCL 2 MG/2ML IJ SOLN
INTRAMUSCULAR | Status: AC
  Filled 2019-01-25: qty 2

## 2019-01-25 MED ORDER — LACTATED RINGERS IV SOLN
INTRAVENOUS | Status: DC | PRN
  Administered 2019-01-25 (×2): via INTRAVENOUS

## 2019-01-25 MED ORDER — LIDOCAINE HCL (PF) 2 % IJ SOLN
INTRAMUSCULAR | Status: AC
  Filled 2019-01-25: qty 10

## 2019-01-25 MED ORDER — METRONIDAZOLE IN NACL 5-0.79 MG/ML-% IV SOLN
500.0000 mg | Freq: Three times a day (TID) | INTRAVENOUS | Status: DC
  Administered 2019-01-25 – 2019-01-26 (×2): 500 mg via INTRAVENOUS
  Filled 2019-01-25 (×4): qty 100

## 2019-01-25 MED ORDER — LIDOCAINE HCL (CARDIAC) PF 100 MG/5ML IV SOSY
PREFILLED_SYRINGE | INTRAVENOUS | Status: DC | PRN
  Administered 2019-01-25: 100 mg via INTRAVENOUS

## 2019-01-25 MED ORDER — ENOXAPARIN SODIUM 40 MG/0.4ML ~~LOC~~ SOLN
40.0000 mg | SUBCUTANEOUS | Status: DC
  Administered 2019-01-26: 40 mg via SUBCUTANEOUS
  Filled 2019-01-25: qty 0.4

## 2019-01-25 MED ORDER — ROCURONIUM BROMIDE 100 MG/10ML IV SOLN
INTRAVENOUS | Status: DC | PRN
  Administered 2019-01-25: 20 mg via INTRAVENOUS
  Administered 2019-01-25: 45 mg via INTRAVENOUS
  Administered 2019-01-25: 5 mg via INTRAVENOUS

## 2019-01-25 MED ORDER — ROCURONIUM BROMIDE 50 MG/5ML IV SOLN
INTRAVENOUS | Status: AC
  Filled 2019-01-25: qty 1

## 2019-01-25 MED ORDER — CEFAZOLIN SODIUM-DEXTROSE 2-4 GM/100ML-% IV SOLN
INTRAVENOUS | Status: AC
  Filled 2019-01-25: qty 100

## 2019-01-25 MED ORDER — BUPIVACAINE-EPINEPHRINE (PF) 0.5% -1:200000 IJ SOLN
INTRAMUSCULAR | Status: DC | PRN
  Administered 2019-01-25: 16 mL
  Administered 2019-01-25: 2 mL

## 2019-01-25 MED ORDER — SUGAMMADEX SODIUM 200 MG/2ML IV SOLN
INTRAVENOUS | Status: DC | PRN
  Administered 2019-01-25: 200 mg via INTRAVENOUS

## 2019-01-25 MED ORDER — ACETAMINOPHEN 10 MG/ML IV SOLN
INTRAVENOUS | Status: AC
  Filled 2019-01-25: qty 100

## 2019-01-25 MED ORDER — ACETAMINOPHEN 10 MG/ML IV SOLN
INTRAVENOUS | Status: DC | PRN
  Administered 2019-01-25: 1000 mg via INTRAVENOUS

## 2019-01-25 MED ORDER — FENTANYL CITRATE (PF) 100 MCG/2ML IJ SOLN
INTRAMUSCULAR | Status: DC | PRN
  Administered 2019-01-25 (×2): 50 ug via INTRAVENOUS

## 2019-01-25 MED ORDER — ONDANSETRON HCL 4 MG/2ML IJ SOLN: 4.0000 mg | Freq: Four times a day (QID) | INTRAMUSCULAR | Status: DC | PRN

## 2019-01-25 MED ORDER — PHENYLEPHRINE HCL 10 MG/ML IJ SOLN
INTRAMUSCULAR | Status: DC | PRN
  Administered 2019-01-25: 200 ug via INTRAVENOUS
  Administered 2019-01-25 (×2): 100 ug via INTRAVENOUS

## 2019-01-25 SURGICAL SUPPLY — 44 items
ANCHOR TIS RET SYS 235ML (MISCELLANEOUS) ×2 IMPLANT
APPLIER CLIP 5 13 M/L LIGAMAX5 (MISCELLANEOUS) ×3
BLADE SURG SZ11 CARB STEEL (BLADE) ×3 IMPLANT
CANISTER SUCT 1200ML W/VALVE (MISCELLANEOUS) ×3 IMPLANT
CATH CHOLANG 76X19 KUMAR (CATHETERS) ×3 IMPLANT
CHLORAPREP W/TINT 26 (MISCELLANEOUS) ×3 IMPLANT
CLIP APPLIE 5 13 M/L LIGAMAX5 (MISCELLANEOUS) ×1 IMPLANT
COVER WAND RF STERILE (DRAPES) ×3 IMPLANT
DERMABOND ADVANCED (GAUZE/BANDAGES/DRESSINGS) ×2
DERMABOND ADVANCED .7 DNX12 (GAUZE/BANDAGES/DRESSINGS) ×1 IMPLANT
ELECT REM PT RETURN 9FT ADLT (ELECTROSURGICAL) ×3
ELECTRODE REM PT RTRN 9FT ADLT (ELECTROSURGICAL) ×1 IMPLANT
GAUZE 4X4 16PLY RFD (DISPOSABLE) ×2 IMPLANT
GLOVE BIO SURGEON STRL SZ 6.5 (GLOVE) ×2 IMPLANT
GLOVE BIO SURGEONS STRL SZ 6.5 (GLOVE) ×1
GOWN STRL REUS W/ TWL LRG LVL3 (GOWN DISPOSABLE) ×4 IMPLANT
GOWN STRL REUS W/TWL LRG LVL3 (GOWN DISPOSABLE) ×8
GRASPER SUT TROCAR 14GX15 (MISCELLANEOUS) IMPLANT
HEMOSTAT SURGICEL 2X14 (HEMOSTASIS) ×2 IMPLANT
HEMOSTAT SURGICEL 2X3 (HEMOSTASIS) IMPLANT
IRRIGATION STRYKERFLOW (MISCELLANEOUS) ×1 IMPLANT
IRRIGATOR STRYKERFLOW (MISCELLANEOUS) ×3
IV NS 1000ML (IV SOLUTION) ×2
IV NS 1000ML BAXH (IV SOLUTION) ×1 IMPLANT
KIT TURNOVER KIT A (KITS) ×3 IMPLANT
LABEL OR SOLS (LABEL) ×3 IMPLANT
NDL HYPO 25X1 1.5 SAFETY (NEEDLE) ×1 IMPLANT
NDL INSUFFLATION 14GA 120MM (NEEDLE) ×1 IMPLANT
NEEDLE HYPO 25X1 1.5 SAFETY (NEEDLE) ×3 IMPLANT
NEEDLE INSUFFLATION 14GA 120MM (NEEDLE) ×3 IMPLANT
NS IRRIG 500ML POUR BTL (IV SOLUTION) ×3 IMPLANT
PACK LAP CHOLECYSTECTOMY (MISCELLANEOUS) ×3 IMPLANT
POUCH SPECIMEN RETRIEVAL 10MM (ENDOMECHANICALS) ×3 IMPLANT
SCISSORS METZENBAUM CVD 33 (INSTRUMENTS) ×3 IMPLANT
SET TUBE SMOKE EVAC HIGH FLOW (TUBING) ×3 IMPLANT
SLEEVE ENDOPATH XCEL 5M (ENDOMECHANICALS) ×6 IMPLANT
SUT MNCRL 4-0 (SUTURE) ×2
SUT MNCRL 4-0 27XMFL (SUTURE) ×1
SUT MNCRL AB 4-0 PS2 18 (SUTURE) ×3 IMPLANT
SUT VIC AB 0 CT1 36 (SUTURE) IMPLANT
SUT VICRYL 0 AB UR-6 (SUTURE) ×7 IMPLANT
SUTURE MNCRL 4-0 27XMF (SUTURE) IMPLANT
TROCAR XCEL NON-BLD 11X100MML (ENDOMECHANICALS) ×3 IMPLANT
TROCAR XCEL NON-BLD 5MMX100MML (ENDOMECHANICALS) ×3 IMPLANT

## 2019-01-25 NOTE — H&P (Signed)
PATIENT PROFILE: Alexis Ruiz is a 58 y.o. female who presents to the Clinic for consultation at the request of Dr. Duanne Moron for evaluation of acute cholecystitis  PCP:  None  HISTORY OF PRESENT ILLNESS: Alexis Ruiz reports she started having mild abdominal pain 4 days ago, this last Sunday.  The pain was mild and she was able to eat until Tuesday night that she started having significant epigastric and right upper quadrant pain.  At the beginning she thought that it was muscular pain but the pain did not improve.  The pain does not radiate to her back or any other part of the body.  She does not associate any alleviating or aggravating factor. On Wednesday morning she went to the walk-in clinic and she was evaluated by Dr. Duanne Moron. Upon his evaluation, he ordered labs and abdominal xray. Labs showed 12,000 WBC, normal lipase, normal liver enzymes. Abdominal xray is negative for abdominal pathology. I personally evaluated the labs and the abdominal imaging. With the abdominal xray, Dr. Duanne Moron diagnosed her with acute cholecystitis without the proper workup. Today she feels that she feels better with the pain medications and antibiotic therapy.    PROBLEM LIST:     Problem List  Date Reviewed: 01/24/2019   None      GENERAL REVIEW OF SYSTEMS:   General ROS: negative for - chills, fatigue, fever, weight gain or weight loss Allergy and Immunology ROS: negative for - hives  Hematological and Lymphatic ROS: negative for - bleeding problems or bruising, negative for palpable nodes Endocrine ROS: negative for - heat or cold intolerance, hair changes Respiratory ROS: negative for - cough, shortness of breath or wheezing Cardiovascular ROS: no chest pain or palpitations GI ROS: negative for nausea, vomiting, diarrhea, constipation. Positive for abdominal pain. Musculoskeletal ROS: negative for - joint swelling or muscle pain Neurological ROS: negative for - confusion,  syncope Dermatological ROS: negative for pruritus and rash Psychiatric: negative for anxiety, depression, difficulty sleeping and memory loss  MEDICATIONS: CurrentMedications        Current Outpatient Medications  Medication Sig Dispense Refill  . ciprofloxacin HCl (CIPRO) 500 MG tablet Take 1 tablet (500 mg total) by mouth 2 (two) times daily for 10 days 20 tablet 0  . hydroCHLOROthiazide (HYDRODIURIL) 25 MG tablet hydrochlorothiazide 25 mg tablet    . HYDROcodone-acetaminophen (NORCO) 5-325 mg tablet Take 1 tablet by mouth every 6 (six) hours as needed for up to 12 doses 12 tablet 0  . metroNIDAZOLE (FLAGYL) 500 MG tablet Take 1 tablet (500 mg total) by mouth 3 (three) times daily for 10 days Do not drink alcohol while taking metronidazole. 30 tablet 0  . promethazine (PHENERGAN) 25 MG tablet Take 1 tablet (25 mg total) by mouth every 6 (six) hours as needed for Nausea (or mild discomfort) for up to 7 days 15 tablet 0   No current facility-administered medications for this visit.       ALLERGIES: Tetracycline  PAST MEDICAL HISTORY:     Past Medical History:  Diagnosis Date  . Allergy   . Hypertension     PAST SURGICAL HISTORY:      Past Surgical History:  Procedure Laterality Date  . LAMINECTOMY LUMBAR SPINE  2016   [facility]  . TONSILLECTOMY  1969     FAMILY HISTORY:      Family History  Problem Relation Age of Onset  . High blood pressure (Hypertension) Mother   . Hyperlipidemia (Elevated cholesterol) Mother   . Coronary Artery  Disease (Blocked arteries around heart) Father   . Hyperlipidemia (Elevated cholesterol) Father   . High blood pressure (Hypertension) Father   . Hyperlipidemia (Elevated cholesterol) Sister   . High blood pressure (Hypertension) Sister   . Coronary Artery Disease (Blocked arteries around heart) Paternal Uncle   . Cancer Maternal Grandmother   . Coronary Artery Disease (Blocked arteries around heart) Maternal  Grandfather   . Diabetes Paternal Grandfather   . Stroke Paternal Grandfather   . Emphysema Sister   . No Known Problems Sister      SOCIAL HISTORY: Social History          Socioeconomic History  . Marital status: Married    Spouse name: Not on file  . Number of children: Not on file  . Years of education: Not on file  . Highest education level: Not on file  Occupational History  . Not on file  Social Needs  . Financial resource strain: Not on file  . Food insecurity:    Worry: Not on file    Inability: Not on file  . Transportation needs:    Medical: Not on file    Non-medical: Not on file  Tobacco Use  . Smoking status: Never Smoker  . Smokeless tobacco: Never Used  Substance and Sexual Activity  . Alcohol use: Yes    Comment: a few a week  . Drug use: Never  . Sexual activity: Not on file  Other Topics Concern  . Not on file  Social History Narrative  . Not on file      PHYSICAL EXAM:    Vitals:   01/25/19 1318  BP: 117/76  Pulse: (!) 120   Body mass index is 31.17 kg/m. Weight: 93 kg (205 lb 0.4 oz)   GENERAL: Alert, active, oriented x3  HEENT: Pupils equal reactive to light. Extraocular movements are intact. Sclera clear. Palpebral conjunctiva normal red color.Pharynx clear.  NECK: Supple with no palpable mass and no adenopathy.  LUNGS: Sound clear with no rales rhonchi or wheezes.  HEART: Regular rhythm S1 and S2 without murmur.  ABDOMEN: Soft and depressible, nontender with no palpable mass, no hepatomegaly.   EXTREMITIES: Well-developed well-nourished symmetrical with no dependent edema.  NEUROLOGICAL: Awake alert oriented, facial expression symmetrical, moving all extremities.  REVIEW OF DATA: I have reviewed the following data today:      Initial consult on 01/24/2019  Component Date Value  . Glucose 01/24/2019 115*  . Sodium 01/24/2019 136   . Potassium 01/24/2019 4.3   . Chloride 01/24/2019 97    . Carbon Dioxide (CO2) 01/24/2019 33.1*  . Urea Nitrogen (BUN) 01/24/2019 15   . Creatinine 01/24/2019 0.7   . Glomerular Filtration Ra* 01/24/2019 86   . Calcium 01/24/2019 9.8   . AST  01/24/2019 50*  . ALT  01/24/2019 30   . Alk Phos (alkaline Phosp* 01/24/2019 76   . Albumin 01/24/2019 4.5   . Bilirubin, Total 01/24/2019 0.6   . Protein, Total 01/24/2019 7.8   . A/G Ratio 01/24/2019 1.4   . WBC (White Blood Cell Co* 01/24/2019 12.0*  . RBC (Red Blood Cell Coun* 01/24/2019 4.64   . Hemoglobin 01/24/2019 14.2   . Hematocrit 01/24/2019 42.8   . MCV (Mean Corpuscular Vo* 01/24/2019 92.2   . MCH (Mean Corpuscular He* 01/24/2019 30.6   . MCHC (Mean Corpuscular H* 01/24/2019 33.2   . Platelet Count 01/24/2019 287   . RDW-CV (Red Cell Distrib* 01/24/2019 12.3   . MPV (  Mean Platelet Volum* 01/24/2019 9.0*  . Neutrophils 01/24/2019 9.43*  . Lymphocytes 01/24/2019 1.25   . Monocytes 01/24/2019 1.09   . Eosinophils 01/24/2019 0.12   . Basophils 01/24/2019 0.04   . Neutrophil % 01/24/2019 78.9*  . Lymphocyte % 01/24/2019 10.5   . Monocyte % 01/24/2019 9.1   . Eosinophil % 01/24/2019 1.0   . Basophil% 01/24/2019 0.3   . Immature Granulocyte % 01/24/2019 0.2   . Immature Granulocyte Cou* 01/24/2019 0.02   . Amylase 01/24/2019 46   . Lipase 01/24/2019 15   . Color 01/24/2019 Yellow   . Clarity 01/24/2019 Clear   . Specific Gravity 01/24/2019 1.025   . pH, Urine 01/24/2019 6.5   . Protein, Urinalysis 01/24/2019 Negative   . Glucose, Urinalysis 01/24/2019 Negative   . Ketones, Urinalysis 01/24/2019 Negative   . Blood, Urinalysis 01/24/2019 Moderate*  . Nitrite, Urinalysis 01/24/2019 Negative   . Leukocyte Esterase, Urin* 01/24/2019 Negative   . White Blood Cells, Urina* 01/24/2019 None Seen   . Red Blood Cells, Urinaly* 01/24/2019 4-10*  . Bacteria, Urinalysis 01/24/2019 None Seen   . Squamous Epithelial Cell* 01/24/2019 None Seen      ASSESSMENT: Ms. Bollig is a 58 y.o.  female presenting for consultation for acute cholecystitis.    Patient here today with right upper quadrant pain.  Acute pain started 2 days ago.  At the walk-in clinic she was diagnosed with acute cholecystitis and was sent home with oral antibiotic. I consider that acute cholecystitis cannot be diagnosed without at least an ultrasound of the abdomen. At the moment of my evaluation, the patient does not has fever, nausea, vomiting and pain has improved. I she had acute cholecystitis patient should had been sent to the ED or a surgeon at the moment of suspicion. This office did not receive any call yesterday about the suspicion of acute cholecystitis and the patient was sent home to see a surgeon in one day or two. Now patient has been almost 48 hours with the suspected diagnosis of acute cholecystitis without the proper workup. This has delayed the treatment of cholecystitis if confirmed. Today I ordered the proper imaging study and with the results I will call the patient for further management. I oriented the patient about the possibility of needed to be admitted as soon as the results are out and even doing emergent surgery. This will definitely will make the surgery more difficult due to the delay in adequate workup when she was initially seen by Dr. Duanne Moron. Since the patient has improved clinically, I discuss with her that I can continue her workup as outpatient but I will follow her very closely with phone calls and further instruction. She will need to continue the oral antibiotic therapy.  I also oriented about what is the gallbladder, its anatomy and function and the implications of having stones. The patient was oriented about the treatment alternatives (observation vs cholecystectomy). Patient was oriented that a low percentage of patient will continue to have similar pain symptoms even after the gallbladder is removed. Surgical technique (open vs laparoscopic) was discussed. It was also discussed  the goals of the surgery (decrease the pain episodes and avoid the risk of cholecystitis) and the risk of surgery including: bleeding, infection, common bile duct injury, stone retention, injury to other organs such as bowel, liver, stomach, other complications such as hernia, bowel obstruction among others. Also discussed with patient about anesthesia and its complications such as: reaction to medications, pneumonia, heart complications, death,  among others.  I evaluated the images of the abdominal ultrasound. Patient found with acute cholecystitis. Patient called back, will perform cholecystectomy today.   PLAN: 1. Abdominal ultrasound STAT- done 2. Laparoscopic cholecystectomy  Patient and her husband verbalized understanding, all questions were answered, and were agreeable with the plan outlined above.   Herbert Pun, MD  Electronically signed by Herbert Pun, MD

## 2019-01-25 NOTE — Interval H&P Note (Signed)
History and Physical Interval Note:  01/25/2019 9:18 PM  Alexis Ruiz  has presented today for surgery, with the diagnosis of K80.20 CALCULUS OF GALL BLADDER W/O CHOLITITHESIS W/O OBSTRUCTION.  The various methods of treatment have been discussed with the patient and family. After consideration of risks, benefits and other options for treatment, the patient has consented to  Procedure(s): LAPAROSCOPIC CHOLECYSTECTOMY (N/A) as a surgical intervention.  The patient's history has been reviewed, patient examined, no change in status, stable for surgery.  I have reviewed the patient's chart and labs.  Questions were answered to the patient's satisfaction.     Herbert Pun

## 2019-01-25 NOTE — Anesthesia Postprocedure Evaluation (Signed)
Anesthesia Post Note  Patient: Alexis Ruiz  Procedure(s) Performed: LAPAROSCOPIC CHOLECYSTECTOMY (N/A Abdomen)  Patient location during evaluation: PACU Anesthesia Type: General Level of consciousness: awake and alert Pain management: pain level controlled Vital Signs Assessment: post-procedure vital signs reviewed and stable Respiratory status: spontaneous breathing, nonlabored ventilation, respiratory function stable and patient connected to nasal cannula oxygen Cardiovascular status: blood pressure returned to baseline and stable Postop Assessment: no apparent nausea or vomiting Anesthetic complications: no     Last Vitals:  Vitals:   01/25/19 2200 01/25/19 2215  BP: 123/85 (!) 145/93  Pulse: 67 72  Resp: 15 18  Temp:    SpO2: 96% 96%    Last Pain:  Vitals:   01/25/19 2200  TempSrc:   PainSc: 1                  Martha Clan

## 2019-01-25 NOTE — Anesthesia Procedure Notes (Signed)
Procedure Name: Intubation Date/Time: 01/25/2019 5:27 PM Performed by: Aline Brochure, CRNA Pre-anesthesia Checklist: Patient identified, Emergency Drugs available, Suction available and Patient being monitored Patient Re-evaluated:Patient Re-evaluated prior to induction Oxygen Delivery Method: Circle system utilized Preoxygenation: Pre-oxygenation with 100% oxygen Induction Type: IV induction Ventilation: Mask ventilation without difficulty Laryngoscope Size: Mac and 3 Grade View: Grade I Tube type: Oral Tube size: 7.0 mm Number of attempts: 1 Airway Equipment and Method: Stylet Placement Confirmation: ETT inserted through vocal cords under direct vision,  positive ETCO2 and breath sounds checked- equal and bilateral Secured at: 21 cm Tube secured with: Tape Dental Injury: Teeth and Oropharynx as per pre-operative assessment

## 2019-01-25 NOTE — Op Note (Signed)
Preoperative diagnosis: Acute cholecystitis  Postoperative diagnosis: Acute cholecystitis.  Procedure: Laparoscopic Cholecystectomy.   Anesthesia: GETA   Surgeon: Dr. Windell Moment  Wound Classification: Clean contaminated  Indications: Patient is a 58 y.o. female developed right upper quadrant pain 4 days ago and on workup was found to have cholelithiasis with a normal common duct and significant amount of pericholecystic fluid and wall thickness consistent with cholecystitis. Laparoscopic cholecystectomy was elected.  Findings: Severely inflamed gallbladder, tense and very thick Critical view of safety achieved Cystic duct and artery identified, ligated and divided Adequate hemostasis  Description of procedure: The patient was placed on the operating table in the supine position. General anesthesia was induced. A time-out was completed verifying correct patient, procedure, site, positioning, and implant(s) and/or special equipment prior to beginning this procedure. An orogastric tube was placed. The abdomen was prepped and draped in the usual sterile fashion.  An incision was made in a natural skin line above the umbilicus.  The fascia was elevated and the Veress needle inserted. Proper position was confirmed by aspiration and saline meniscus test.  The abdomen was insufflated with carbon dioxide to a pressure of 15 mmHg. The patient tolerated insufflation well. A 11-mm trocar was then inserted.  The laparoscope was inserted and the abdomen inspected. No injuries from initial trocar placement were noted. Additional trocars were then inserted in the following locations: a 5-mm trocar in the right epigastrium and two 5-mm trocars along the right costal margin. The abdomen was inspected and no abnormalities were found. The table was placed in the reverse Trendelenburg position with the right side up.  Filmy severe adhesions between the gallbladder and omentum, duodenum and transverse colon  were lysed sharply. The gallbladder needed to be aspirated to be able to be grasped with an atraumatic grasper passed through the lateral port and retracted over the dome of the liver. Difficult dissection was needed to be done around the infundibulum to be able to identify the infundibulum. The infundibulum was also grasped with an atraumatic grasper through the midclavicular port and retracted toward the right lower quadrant. With [facility], suction and cautery, the Calot's triangle was able to be exposed. The peritoneum overlying the gallbladder infundibulum was then incised and the cystic duct and cystic artery identified and circumferentially dissected. Critical view of safety reviewed before ligating any structure. The cystic duct and cystic artery were then doubly clipped and divided close to the gallbladder.  The gallbladder was then dissected from its peritoneal attachments by electrocautery. Hemostasis was checked and the gallbladder and contained stones were removed using an endoscopic retrieval bag placed through the umbilical port. The umbilical casia opening was needed to be extended due to the size of the gallbladder. The gallbladder was passed off the table as a specimen. The gallbladder fossa was copiously irrigated with saline and hemostasis was obtained. There was no evidence of bleeding from the gallbladder fossa or cystic artery or leakage of the bile from the cystic duct stump. Secondary trocars were removed under direct vision. No bleeding was noted. The laparoscope was withdrawn and the umbilical trocar removed. The abdomen was allowed to collapse. The fascia of the 74mm trocar sites was closed with figure-of-eight 0 vicryl sutures. The skin was closed with subcuticular sutures of 4-0 monocryl and topical skin adhesive. The orogastric tube was removed.  The patient tolerated the procedure well and was taken to the postanesthesia care unit in stable condition.   Specimen:  Gallbladder  Complications: None  EBL: 50  mL

## 2019-01-25 NOTE — Transfer of Care (Signed)
Immediate Anesthesia Transfer of Care Note  Patient: Alexis Ruiz  Procedure(s) Performed: LAPAROSCOPIC CHOLECYSTECTOMY (N/A Abdomen)  Patient Location: PACU  Anesthesia Type:General  Level of Consciousness: sedated  Airway & Oxygen Therapy: Patient connected to face mask oxygen  Post-op Assessment: Post -op Vital signs reviewed and stable  Post vital signs: stable  Last Vitals:  Vitals Value Taken Time  BP 132/73 01/25/2019  9:11 PM  Temp 36.3 C 01/25/2019  9:11 PM  Pulse 79 01/25/2019  9:12 PM  Resp 19 01/25/2019  9:12 PM  SpO2 100 % 01/25/2019  9:12 PM  Vitals shown include unvalidated device data.  Last Pain:  Vitals:   01/25/19 1702  TempSrc: Temporal  PainSc: 0-No pain         Complications: No apparent anesthesia complications

## 2019-01-25 NOTE — Anesthesia Preprocedure Evaluation (Signed)
Anesthesia Evaluation  Patient identified by MRN, date of birth, ID band Patient awake    Reviewed: Allergy & Precautions, H&P , NPO status , Patient's Chart, lab work & pertinent test results, reviewed documented beta blocker date and time   History of Anesthesia Complications Negative for: history of anesthetic complications  Airway Mallampati: I  TM Distance: >3 FB Neck ROM: full    Dental no notable dental hx. (+) Dental Advidsory Given, Teeth Intact   Pulmonary neg pulmonary ROS,           Cardiovascular Exercise Tolerance: Good hypertension, (-) angina(-) CAD, (-) Past MI, (-) Cardiac Stents and (-) CABG (-) dysrhythmias + Valvular Problems/Murmurs (when pregnant)      Neuro/Psych negative neurological ROS  negative psych ROS   GI/Hepatic negative GI ROS, Neg liver ROS,   Endo/Other  negative endocrine ROS  Renal/GU negative Renal ROS  negative genitourinary   Musculoskeletal   Abdominal   Peds  Hematology negative hematology ROS (+)   Anesthesia Other Findings Past Medical History: No date: Arthritis No date: Hypertension   Reproductive/Obstetrics negative OB ROS                             Anesthesia Physical Anesthesia Plan  ASA: II  Anesthesia Plan: General   Post-op Pain Management:    Induction: Intravenous, Cricoid pressure planned and Rapid sequence  PONV Risk Score and Plan: 3 and Ondansetron, Midazolam, Promethazine and Treatment may vary due to age or medical condition  Airway Management Planned: Oral ETT  Additional Equipment:   Intra-op Plan:   Post-operative Plan: Extubation in OR  Informed Consent: I have reviewed the patients History and Physical, chart, labs and discussed the procedure including the risks, benefits and alternatives for the proposed anesthesia with the patient or authorized representative who has indicated his/her understanding and  acceptance.     Dental Advisory Given  Plan Discussed with: Anesthesiologist, CRNA and Surgeon  Anesthesia Plan Comments:         Anesthesia Quick Evaluation

## 2019-01-25 NOTE — Anesthesia Post-op Follow-up Note (Signed)
Anesthesia QCDR form completed.        

## 2019-01-25 NOTE — H&P (View-Only) (Signed)
PATIENT PROFILE: Alexis Ruiz is a 58 y.o. female who presents to the Clinic for consultation at the request of Dr. Duanne Moron for evaluation of acute cholecystitis  PCP:  None  HISTORY OF PRESENT ILLNESS: Alexis Ruiz reports she started having mild abdominal pain 4 days ago, this last Sunday.  The pain was mild and she was able to eat until Tuesday night that she started having significant epigastric and right upper quadrant pain.  At the beginning she thought that it was muscular pain but the pain did not improve.  The pain does not radiate to her back or any other part of the body.  She does not associate any alleviating or aggravating factor. On Wednesday morning she went to the walk-in clinic and she was evaluated by Dr. Duanne Moron. Upon his evaluation, he ordered labs and abdominal xray. Labs showed 12,000 WBC, normal lipase, normal liver enzymes. Abdominal xray is negative for abdominal pathology. I personally evaluated the labs and the abdominal imaging. With the abdominal xray, Dr. Duanne Moron diagnosed her with acute cholecystitis without the proper workup. Today she feels that she feels better with the pain medications and antibiotic therapy.    PROBLEM LIST:     Problem List  Date Reviewed: 01/24/2019   None      GENERAL REVIEW OF SYSTEMS:   General ROS: negative for - chills, fatigue, fever, weight gain or weight loss Allergy and Immunology ROS: negative for - hives  Hematological and Lymphatic ROS: negative for - bleeding problems or bruising, negative for palpable nodes Endocrine ROS: negative for - heat or cold intolerance, hair changes Respiratory ROS: negative for - cough, shortness of breath or wheezing Cardiovascular ROS: no chest pain or palpitations GI ROS: negative for nausea, vomiting, diarrhea, constipation. Positive for abdominal pain. Musculoskeletal ROS: negative for - joint swelling or muscle pain Neurological ROS: negative for - confusion,  syncope Dermatological ROS: negative for pruritus and rash Psychiatric: negative for anxiety, depression, difficulty sleeping and memory loss  MEDICATIONS: CurrentMedications        Current Outpatient Medications  Medication Sig Dispense Refill  . ciprofloxacin HCl (CIPRO) 500 MG tablet Take 1 tablet (500 mg total) by mouth 2 (two) times daily for 10 days 20 tablet 0  . hydroCHLOROthiazide (HYDRODIURIL) 25 MG tablet hydrochlorothiazide 25 mg tablet    . HYDROcodone-acetaminophen (NORCO) 5-325 mg tablet Take 1 tablet by mouth every 6 (six) hours as needed for up to 12 doses 12 tablet 0  . metroNIDAZOLE (FLAGYL) 500 MG tablet Take 1 tablet (500 mg total) by mouth 3 (three) times daily for 10 days Do not drink alcohol while taking metronidazole. 30 tablet 0  . promethazine (PHENERGAN) 25 MG tablet Take 1 tablet (25 mg total) by mouth every 6 (six) hours as needed for Nausea (or mild discomfort) for up to 7 days 15 tablet 0   No current facility-administered medications for this visit.       ALLERGIES: Tetracycline  PAST MEDICAL HISTORY:     Past Medical History:  Diagnosis Date  . Allergy   . Hypertension     PAST SURGICAL HISTORY:      Past Surgical History:  Procedure Laterality Date  . LAMINECTOMY LUMBAR SPINE  2016   [facility]  . TONSILLECTOMY  1969     FAMILY HISTORY:      Family History  Problem Relation Age of Onset  . High blood pressure (Hypertension) Mother   . Hyperlipidemia (Elevated cholesterol) Mother   . Coronary Artery  Disease (Blocked arteries around heart) Father   . Hyperlipidemia (Elevated cholesterol) Father   . High blood pressure (Hypertension) Father   . Hyperlipidemia (Elevated cholesterol) Sister   . High blood pressure (Hypertension) Sister   . Coronary Artery Disease (Blocked arteries around heart) Paternal Uncle   . Cancer Maternal Grandmother   . Coronary Artery Disease (Blocked arteries around heart) Maternal  Grandfather   . Diabetes Paternal Grandfather   . Stroke Paternal Grandfather   . Emphysema Sister   . No Known Problems Sister      SOCIAL HISTORY: Social History          Socioeconomic History  . Marital status: Married    Spouse name: Not on file  . Number of children: Not on file  . Years of education: Not on file  . Highest education level: Not on file  Occupational History  . Not on file  Social Needs  . Financial resource strain: Not on file  . Food insecurity:    Worry: Not on file    Inability: Not on file  . Transportation needs:    Medical: Not on file    Non-medical: Not on file  Tobacco Use  . Smoking status: Never Smoker  . Smokeless tobacco: Never Used  Substance and Sexual Activity  . Alcohol use: Yes    Comment: a few a week  . Drug use: Never  . Sexual activity: Not on file  Other Topics Concern  . Not on file  Social History Narrative  . Not on file      PHYSICAL EXAM:    Vitals:   01/25/19 1318  BP: 117/76  Pulse: (!) 120   Body mass index is 31.17 kg/m. Weight: 93 kg (205 lb 0.4 oz)   GENERAL: Alert, active, oriented x3  HEENT: Pupils equal reactive to light. Extraocular movements are intact. Sclera clear. Palpebral conjunctiva normal red color.Pharynx clear.  NECK: Supple with no palpable mass and no adenopathy.  LUNGS: Sound clear with no rales rhonchi or wheezes.  HEART: Regular rhythm S1 and S2 without murmur.  ABDOMEN: Soft and depressible, nontender with no palpable mass, no hepatomegaly.   EXTREMITIES: Well-developed well-nourished symmetrical with no dependent edema.  NEUROLOGICAL: Awake alert oriented, facial expression symmetrical, moving all extremities.  REVIEW OF DATA: I have reviewed the following data today:      Initial consult on 01/24/2019  Component Date Value  . Glucose 01/24/2019 115*  . Sodium 01/24/2019 136   . Potassium 01/24/2019 4.3   . Chloride 01/24/2019 97    . Carbon Dioxide (CO2) 01/24/2019 33.1*  . Urea Nitrogen (BUN) 01/24/2019 15   . Creatinine 01/24/2019 0.7   . Glomerular Filtration Ra* 01/24/2019 86   . Calcium 01/24/2019 9.8   . AST  01/24/2019 50*  . ALT  01/24/2019 30   . Alk Phos (alkaline Phosp* 01/24/2019 76   . Albumin 01/24/2019 4.5   . Bilirubin, Total 01/24/2019 0.6   . Protein, Total 01/24/2019 7.8   . A/G Ratio 01/24/2019 1.4   . WBC (White Blood Cell Co* 01/24/2019 12.0*  . RBC (Red Blood Cell Coun* 01/24/2019 4.64   . Hemoglobin 01/24/2019 14.2   . Hematocrit 01/24/2019 42.8   . MCV (Mean Corpuscular Vo* 01/24/2019 92.2   . MCH (Mean Corpuscular He* 01/24/2019 30.6   . MCHC (Mean Corpuscular H* 01/24/2019 33.2   . Platelet Count 01/24/2019 287   . RDW-CV (Red Cell Distrib* 01/24/2019 12.3   . MPV (  Mean Platelet Volum* 01/24/2019 9.0*  . Neutrophils 01/24/2019 9.43*  . Lymphocytes 01/24/2019 1.25   . Monocytes 01/24/2019 1.09   . Eosinophils 01/24/2019 0.12   . Basophils 01/24/2019 0.04   . Neutrophil % 01/24/2019 78.9*  . Lymphocyte % 01/24/2019 10.5   . Monocyte % 01/24/2019 9.1   . Eosinophil % 01/24/2019 1.0   . Basophil% 01/24/2019 0.3   . Immature Granulocyte % 01/24/2019 0.2   . Immature Granulocyte Cou* 01/24/2019 0.02   . Amylase 01/24/2019 46   . Lipase 01/24/2019 15   . Color 01/24/2019 Yellow   . Clarity 01/24/2019 Clear   . Specific Gravity 01/24/2019 1.025   . pH, Urine 01/24/2019 6.5   . Protein, Urinalysis 01/24/2019 Negative   . Glucose, Urinalysis 01/24/2019 Negative   . Ketones, Urinalysis 01/24/2019 Negative   . Blood, Urinalysis 01/24/2019 Moderate*  . Nitrite, Urinalysis 01/24/2019 Negative   . Leukocyte Esterase, Urin* 01/24/2019 Negative   . White Blood Cells, Urina* 01/24/2019 None Seen   . Red Blood Cells, Urinaly* 01/24/2019 4-10*  . Bacteria, Urinalysis 01/24/2019 None Seen   . Squamous Epithelial Cell* 01/24/2019 None Seen      ASSESSMENT: Alexis Ruiz is a 58 y.o.  female presenting for consultation for acute cholecystitis.    Patient here today with right upper quadrant pain.  Acute pain started 2 days ago.  At the walk-in clinic she was diagnosed with acute cholecystitis and was sent home with oral antibiotic. I consider that acute cholecystitis cannot be diagnosed without at least an ultrasound of the abdomen. At the moment of my evaluation, the patient does not has fever, nausea, vomiting and pain has improved. I she had acute cholecystitis patient should had been sent to the ED or a surgeon at the moment of suspicion. This office did not receive any call yesterday about the suspicion of acute cholecystitis and the patient was sent home to see a surgeon in one day or two. Now patient has been almost 48 hours with the suspected diagnosis of acute cholecystitis without the proper workup. This has delayed the treatment of cholecystitis if confirmed. Today I ordered the proper imaging study and with the results I will call the patient for further management. I oriented the patient about the possibility of needed to be admitted as soon as the results are out and even doing emergent surgery. This will definitely will make the surgery more difficult due to the delay in adequate workup when she was initially seen by Dr. Duanne Moron. Since the patient has improved clinically, I discuss with her that I can continue her workup as outpatient but I will follow her very closely with phone calls and further instruction. She will need to continue the oral antibiotic therapy.  I also oriented about what is the gallbladder, its anatomy and function and the implications of having stones. The patient was oriented about the treatment alternatives (observation vs cholecystectomy). Patient was oriented that a low percentage of patient will continue to have similar pain symptoms even after the gallbladder is removed. Surgical technique (open vs laparoscopic) was discussed. It was also discussed  the goals of the surgery (decrease the pain episodes and avoid the risk of cholecystitis) and the risk of surgery including: bleeding, infection, common bile duct injury, stone retention, injury to other organs such as bowel, liver, stomach, other complications such as hernia, bowel obstruction among others. Also discussed with patient about anesthesia and its complications such as: reaction to medications, pneumonia, heart complications, death,  among others.  I evaluated the images of the abdominal ultrasound. Patient found with acute cholecystitis. Patient called back, will perform cholecystectomy today.   PLAN: 1. Abdominal ultrasound STAT- done 2. Laparoscopic cholecystectomy  Patient and her husband verbalized understanding, all questions were answered, and were agreeable with the plan outlined above.   Herbert Pun, MD  Electronically signed by Herbert Pun, MD

## 2019-01-26 ENCOUNTER — Ambulatory Visit: Payer: BLUE CROSS/BLUE SHIELD

## 2019-01-26 ENCOUNTER — Encounter: Payer: Self-pay | Admitting: General Surgery

## 2019-01-26 DIAGNOSIS — K812 Acute cholecystitis with chronic cholecystitis: Secondary | ICD-10-CM | POA: Diagnosis not present

## 2019-01-26 MED ORDER — METRONIDAZOLE 500 MG PO TABS
500.0000 mg | ORAL_TABLET | Freq: Three times a day (TID) | ORAL | Status: DC
  Administered 2019-01-26: 500 mg via ORAL
  Filled 2019-01-26 (×3): qty 1

## 2019-01-26 MED ORDER — FAMOTIDINE 20 MG PO TABS
20.0000 mg | ORAL_TABLET | Freq: Two times a day (BID) | ORAL | Status: DC
  Administered 2019-01-26: 20 mg via ORAL
  Filled 2019-01-26: qty 1

## 2019-01-26 MED ORDER — HYDROCODONE-ACETAMINOPHEN 5-325 MG PO TABS: 1.0000 | ORAL_TABLET | ORAL | 0 refills | Status: AC | PRN

## 2019-01-26 MED ORDER — CIPROFLOXACIN HCL 500 MG PO TABS
500.0000 mg | ORAL_TABLET | Freq: Two times a day (BID) | ORAL | Status: DC
  Administered 2019-01-26: 500 mg via ORAL
  Filled 2019-01-26: qty 1

## 2019-01-26 NOTE — Discharge Summary (Signed)
  Patient ID: Alexis Ruiz MRN: [id number] DOB/AGE: 08-Sep-1961 58 y.o.  Admit date: 01/25/2019 Discharge date: 01/26/2019   Discharge Diagnoses:  Active Problems:   Acute cholecystitis    Procedures: Laparoscopic cholecystectomy  Hospital Course: Patient was admitted with acute cholecystitis and underwent laparoscopic cholecystectomy.  Even though he was a difficult surgery came the patient recover pretty well.  Today she has controlled pain with current pain medication.  She tolerated breakfast without nausea or vomiting.  She is ambulating.  She is voiding spontaneously.  There is no prolonged of the wounds today.  Physical Exam  Constitutional: She is well-developed, well-nourished, and in no distress.  Cardiovascular: Normal rate, regular rhythm and normal heart sounds.  Pulmonary/Chest: Effort normal.  Abdominal: Soft. Bowel sounds are normal. She exhibits no distension. There is no abdominal tenderness. There is no rebound.  Wounds is dry and clean.    Consults: None  Disposition: Discharge disposition: 01-Home or Self Care       Discharge Instructions    Diet - low sodium heart healthy   Complete by:  As directed      Allergies as of 01/26/2019      Reactions   Bee Venom Swelling   Tetracyclines & Related Other (See Comments)      Medication List    TAKE these medications   docusate sodium 100 MG capsule Commonly known as:  Colace Take 1 tablet once or twice daily as needed for constipation while taking narcotic pain medicine   hydrochlorothiazide 25 MG tablet Commonly known as:  HYDRODIURIL Take 1 tablet (25 mg total) by mouth daily.   HYDROcodone-acetaminophen 5-325 MG tablet Commonly known as:  Norco Take 1 tablet by mouth every 4 (four) hours as needed for up to 3 days for moderate pain.      Follow-up Information    Herbert Pun, MD Follow up in 2 week(s).   Specialty:  General Surgery Contact information: [address]  [place] [address] [phone]

## 2019-01-26 NOTE — Discharge Instructions (Signed)
°  Diet: Resume home heart healthy regular diet.   Activity: No heavy lifting >20 pounds (children, pets, laundry, garbage) or strenuous activity until follow-up, but light activity and walking are encouraged. Do not drive or drink alcohol if taking narcotic pain medications.  May return to work in one week with restriction of no heavy lifting more than 20 pounds.   Restriction of heavy lifting of 20 pounds is for 4 weeks.   Wound care: May shower with soapy water and pat dry (do not rub incisions), but no baths or submerging incision underwater until follow-up. (no swimming)   Medications: Resume all home medications. For mild to moderate pain: acetaminophen (Tylenol) or ibuprofen (if no kidney disease). Combining Tylenol with alcohol can substantially increase your risk of causing liver disease. Narcotic pain medications, if prescribed, can be used for severe pain, though may cause nausea, constipation, and drowsiness. Do not combine Tylenol and Norco within a 6 hour period as Norco contains Tylenol. If you do not need the narcotic pain medication, you do not need to fill the prescription.  Continue the oral antibiotic therapy Cipro and Flagyl as prescribed before.  If develops constipation with pain medication may use a stool softener or laxative.   Call office [phone]) at any time if any questions, worsening pain, fevers/chills, bleeding, drainage from incision site, or other concerns.

## 2019-01-27 LAB — HIV ANTIBODY (ROUTINE TESTING W REFLEX): HIV Screen 4th Generation wRfx: NONREACTIVE

## 2019-01-28 ENCOUNTER — Telehealth: Payer: Self-pay | Admitting: Surgery

## 2019-01-28 NOTE — Telephone Encounter (Signed)
Received call from patient via on-call service. Patient states she today started experiencing redness over her face and neck, but specifically no hives and nowhere else on her body. She does not believe she has applied any new/different cream/lotion/soap to these areas and expresses concern whether this could represent an allergic reaction to the Cipro and Flagyl she was prescribed last week prior to having underwent laparoscopic cholecystectomy ([facility]ron-Diaz, 3/19), after which she says she was advised to complete her prescribed course of antibiotics. Patient denies SOB, difficulty swallowing, or tongue/lip swelling. Though patient reassured this does not sound like an allergic reaction to her antibiotic, it is okay to discontinue taking her Flagyl. If she develops hives or redness spreads, she should take Benadryl and likewise discontinue Cipro as well. Patient lastly also instructed to call Dr. Darrol Poke office tomorrow morning to discuss further.  Patient otherwise reports she is doing well with only mild peri-incisional abdominal pain.  This communication will be forwarded/copied to Dr. Windell Moment.  -- Marilynne Drivers Rosana Hoes, MD, [place]: [facility] - Partnering for exceptional care. Office: [phone]

## 2019-01-29 LAB — SURGICAL PATHOLOGY

## 2019-04-10 DIAGNOSIS — I10 Essential (primary) hypertension: Secondary | ICD-10-CM | POA: Insufficient documentation

## 2019-04-10 DIAGNOSIS — M51369 Other intervertebral disc degeneration, lumbar region without mention of lumbar back pain or lower extremity pain: Secondary | ICD-10-CM | POA: Insufficient documentation

## 2019-04-10 DIAGNOSIS — E7800 Pure hypercholesterolemia, unspecified: Secondary | ICD-10-CM | POA: Insufficient documentation

## 2019-04-10 DIAGNOSIS — Z0000 Encounter for general adult medical examination without abnormal findings: Secondary | ICD-10-CM | POA: Insufficient documentation

## 2019-11-26 DIAGNOSIS — M47816 Spondylosis without myelopathy or radiculopathy, lumbar region: Secondary | ICD-10-CM | POA: Insufficient documentation

## 2019-11-26 DIAGNOSIS — M5416 Radiculopathy, lumbar region: Secondary | ICD-10-CM | POA: Insufficient documentation

## 2021-06-08 ENCOUNTER — Other Ambulatory Visit: Payer: Self-pay | Admitting: Internal Medicine

## 2021-06-08 DIAGNOSIS — Z1231 Encounter for screening mammogram for malignant neoplasm of breast: Secondary | ICD-10-CM

## 2021-06-22 ENCOUNTER — Other Ambulatory Visit: Payer: Self-pay

## 2021-06-22 ENCOUNTER — Ambulatory Visit
Admission: RE | Admit: 2021-06-22 | Discharge: 2021-06-22 | Disposition: A | Discharge status: Home / Self Care | Payer: BC Managed Care – PPO | Source: Ambulatory Visit | Attending: Internal Medicine | Admitting: Internal Medicine

## 2021-06-22 DIAGNOSIS — Z1231 Encounter for screening mammogram for malignant neoplasm of breast: Secondary | ICD-10-CM | POA: Insufficient documentation

## 2022-02-15 IMAGING — MG MM DIGITAL SCREENING BILAT W/ TOMO AND CAD
8 series · 8 of 24 positions shown · non-contrast
Comparison: Previous exam(s).

CLINICAL DATA: Screening.

EXAM:
DIGITAL SCREENING BILATERAL MAMMOGRAM WITH TOMOSYNTHESIS AND CAD
TECHNIQUE: Bilateral screening digital craniocaudal and mediolateral oblique
mammograms were obtained. Bilateral screening digital breast
tomosynthesis was performed. The images were evaluated with
computer-aided detection.

[R MLO synth-2D]
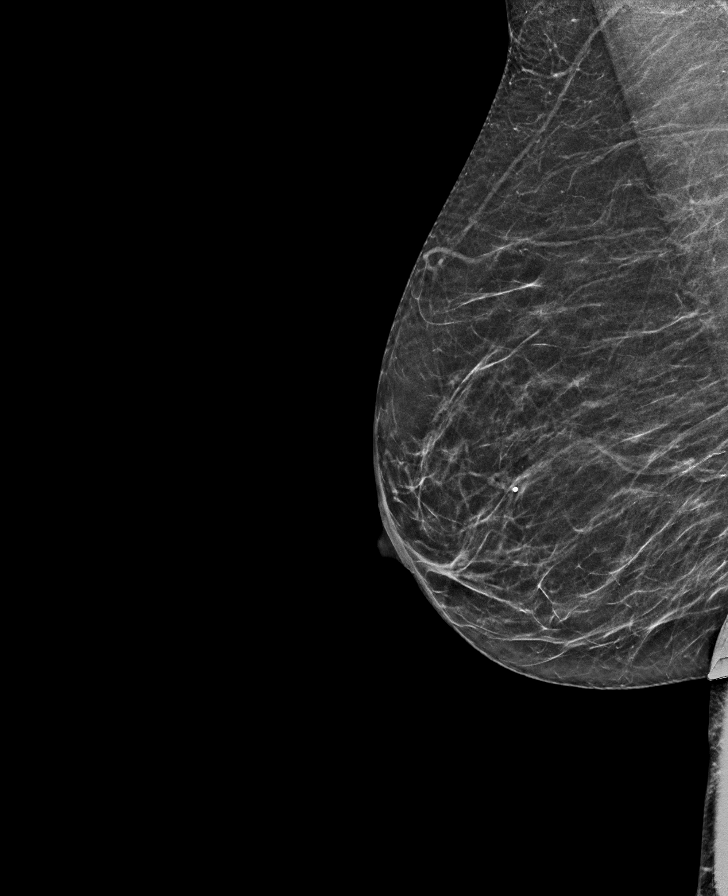

[L CC synth-2D]
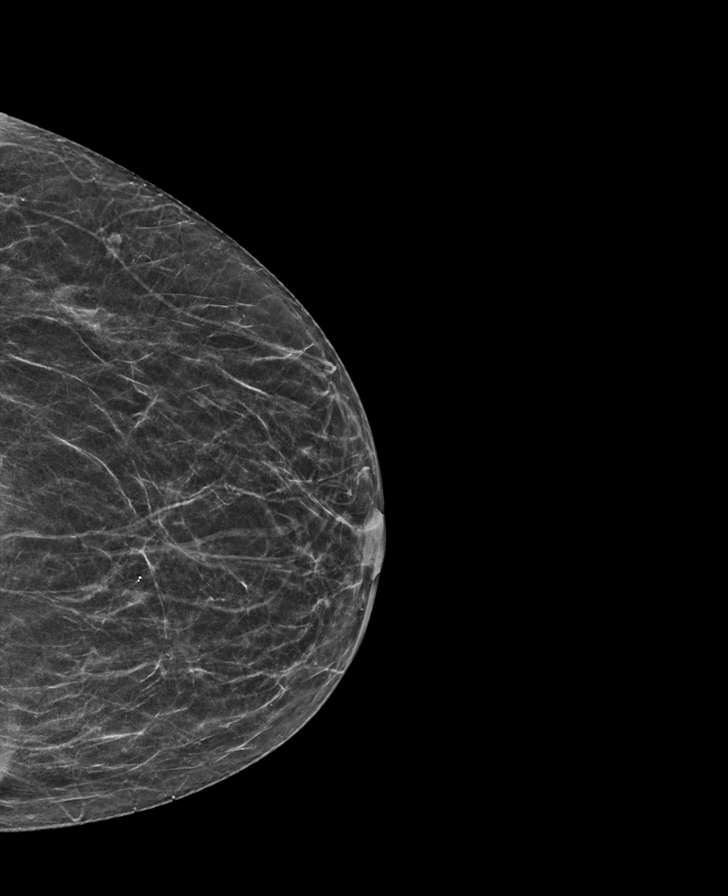

[R CC synth-2D]
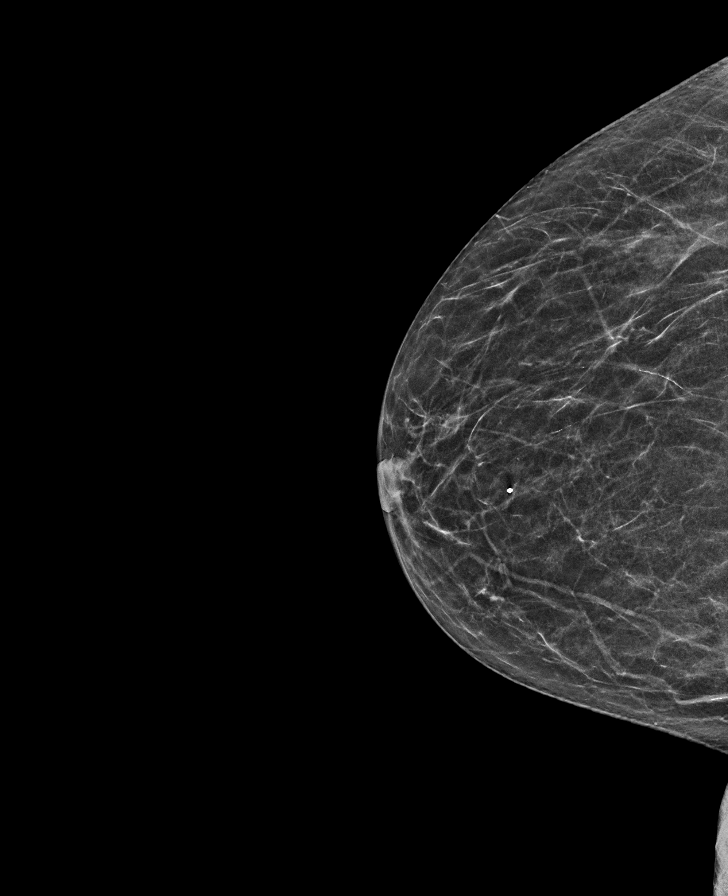

[L MLO synth-2D]
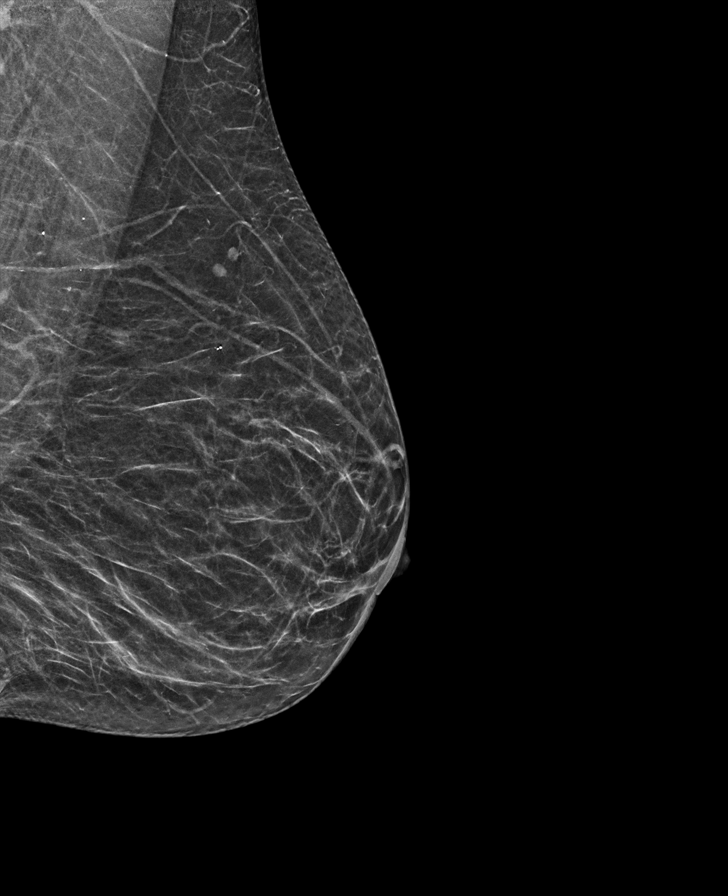

[L CC tomo · tomo slice 24/47.0]
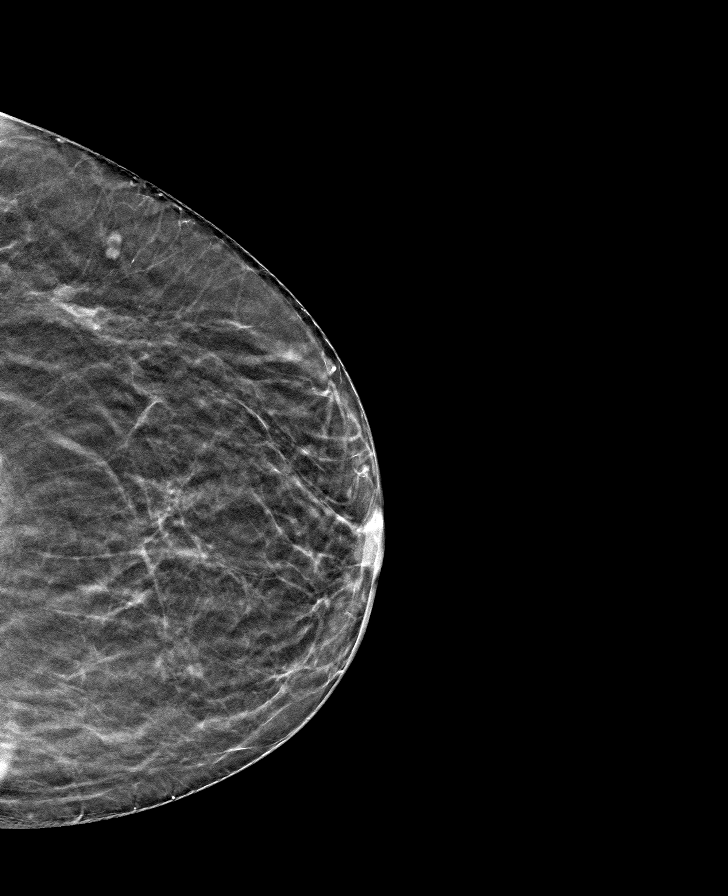

[R MLO tomo · tomo slice 25/49.0]
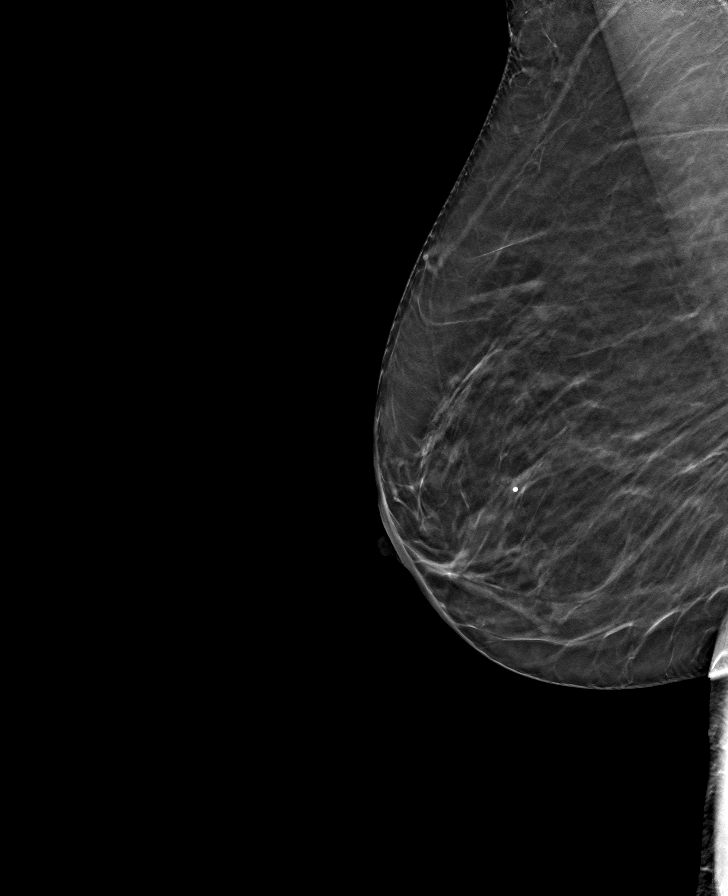

[R CC tomo · tomo slice 23/45.0]
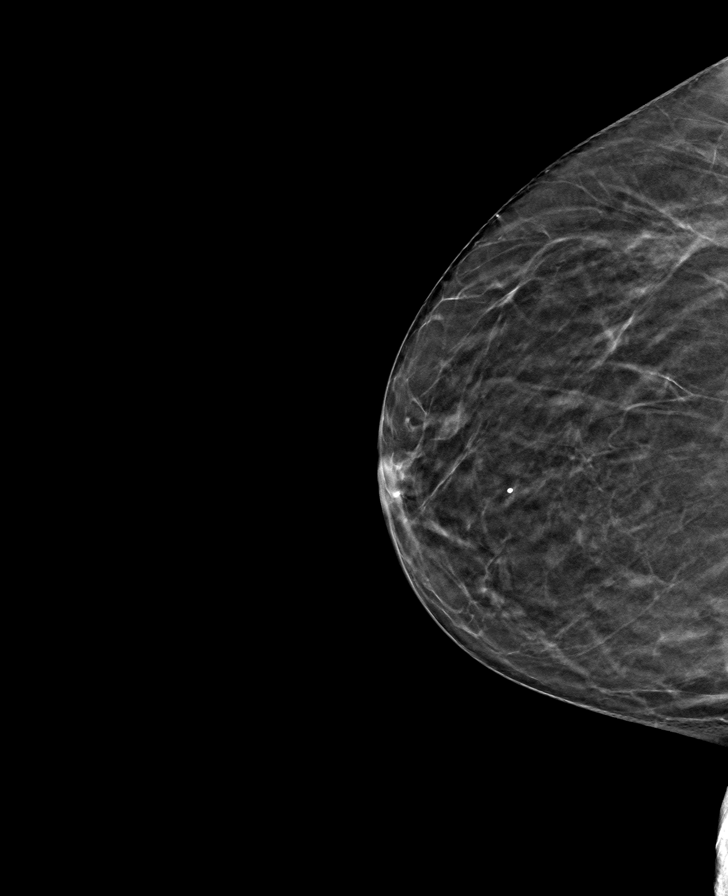

[L MLO tomo · tomo slice 27/52.0]
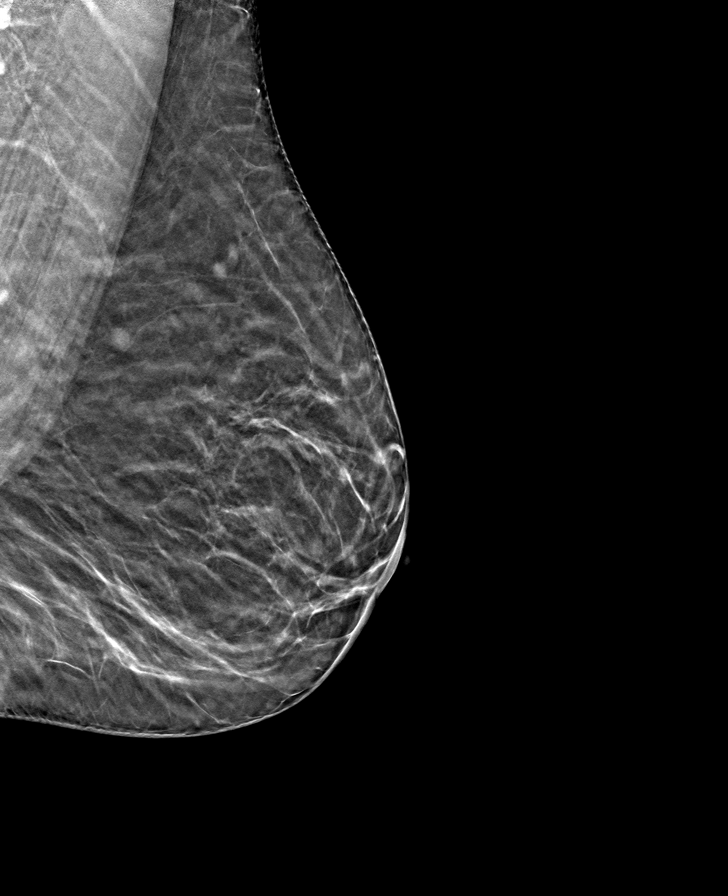

[8 of 24 positions shown; findings below may reference images not displayed]

ACR Breast Density Category b: There are scattered areas of
fibroglandular density.
FINDINGS: There are no findings suspicious for malignancy.
IMPRESSION: No mammographic evidence of malignancy. A result letter of this
screening mammogram will be mailed directly to the patient.

RECOMMENDATION:
Screening mammogram in one year. (Code:51-O-LD2)

BI-RADS CATEGORY  1: Negative.

## 2022-08-16 ENCOUNTER — Other Ambulatory Visit: Payer: Self-pay | Admitting: Internal Medicine

## 2022-08-16 DIAGNOSIS — Z1231 Encounter for screening mammogram for malignant neoplasm of breast: Secondary | ICD-10-CM

## 2022-09-13 ENCOUNTER — Ambulatory Visit
Admission: RE | Admit: 2022-09-13 | Discharge: 2022-09-13 | Disposition: A | Discharge status: Home / Self Care | Payer: BC Managed Care – PPO | Source: Ambulatory Visit | Attending: Internal Medicine | Admitting: Internal Medicine

## 2022-09-13 DIAGNOSIS — Z1231 Encounter for screening mammogram for malignant neoplasm of breast: Secondary | ICD-10-CM | POA: Insufficient documentation

## 2022-10-25 ENCOUNTER — Ambulatory Visit: Payer: BC Managed Care – PPO | Admitting: Dermatology

## 2022-10-25 DIAGNOSIS — L821 Other seborrheic keratosis: Secondary | ICD-10-CM

## 2022-10-25 DIAGNOSIS — H01136 Eczematous dermatitis of left eye, unspecified eyelid: Secondary | ICD-10-CM | POA: Diagnosis not present

## 2022-10-25 DIAGNOSIS — H01139 Eczematous dermatitis of unspecified eye, unspecified eyelid: Secondary | ICD-10-CM

## 2022-10-25 DIAGNOSIS — L609 Nail disorder, unspecified: Secondary | ICD-10-CM

## 2022-10-25 DIAGNOSIS — Z1283 Encounter for screening for malignant neoplasm of skin: Secondary | ICD-10-CM | POA: Diagnosis not present

## 2022-10-25 DIAGNOSIS — L578 Other skin changes due to chronic exposure to nonionizing radiation: Secondary | ICD-10-CM

## 2022-10-25 DIAGNOSIS — H01133 Eczematous dermatitis of right eye, unspecified eyelid: Secondary | ICD-10-CM | POA: Diagnosis not present

## 2022-10-25 DIAGNOSIS — L814 Other melanin hyperpigmentation: Secondary | ICD-10-CM

## 2022-10-25 DIAGNOSIS — L719 Rosacea, unspecified: Secondary | ICD-10-CM

## 2022-10-25 DIAGNOSIS — L918 Other hypertrophic disorders of the skin: Secondary | ICD-10-CM

## 2022-10-25 DIAGNOSIS — Z79899 Other long term (current) drug therapy: Secondary | ICD-10-CM

## 2022-10-25 DIAGNOSIS — B353 Tinea pedis: Secondary | ICD-10-CM

## 2022-10-25 DIAGNOSIS — Z7189 Other specified counseling: Secondary | ICD-10-CM

## 2022-10-25 DIAGNOSIS — D229 Melanocytic nevi, unspecified: Secondary | ICD-10-CM

## 2022-10-25 MED ORDER — KETOCONAZOLE 2 % EX CREA: TOPICAL_CREAM | CUTANEOUS | 11 refills | Status: DC

## 2022-10-25 MED ORDER — PIMECROLIMUS 1 % EX CREA: TOPICAL_CREAM | CUTANEOUS | 0 refills | Status: AC

## 2022-10-25 NOTE — Patient Instructions (Signed)
Due to recent changes in healthcare laws, you may see results of your pathology and/or laboratory studies on MyChart before the doctors have had a chance to review them. We understand that in some cases there may be results that are confusing or concerning to you. Please understand that not all results are received at the same time and often the doctors may need to interpret multiple results in order to provide you with the best plan of care or course of treatment. Therefore, we ask that you please give us 2 business days to thoroughly review all your results before contacting the office for clarification. Should we see a critical lab result, you will be contacted sooner.   If You Need Anything After Your Visit  If you have any questions or concerns for your doctor, please call our main line at [phone] and press option 4 to reach your doctor's medical assistant. If no one answers, please leave a voicemail as directed and we will return your call as soon as possible. Messages left after 4 pm will be answered the following business day.   You may also send us a message via MyChart. We typically respond to MyChart messages within 1-2 business days.  For prescription refills, please ask your pharmacy to contact our office. Our fax number is [phone].  If you have an urgent issue when the clinic is closed that cannot wait until the next business day, you can page your doctor at the number below.    Please note that while we do our best to be available for urgent issues outside of office hours, we are not available 24/7.   If you have an urgent issue and are unable to reach us, you may choose to seek medical care at your doctor's office, retail clinic, urgent care center, or emergency room.  If you have a medical emergency, please immediately call 911 or go to the emergency department.  Pager Numbers  - Dr. Kowalski: [phone]  - Dr. Moye: [phone]  - Dr. Stewart:  [phone]  In the event of inclement weather, please call our main line at [phone] for an update on the status of any delays or closures.  Dermatology Medication Tips: Please keep the boxes that topical medications come in in order to help keep track of the instructions about where and how to use these. Pharmacies typically print the medication instructions only on the boxes and not directly on the medication tubes.   If your medication is too expensive, please contact our office at [phone] option 4 or send us a message through MyChart.   We are unable to tell what your co-pay for medications will be in advance as this is different depending on your insurance coverage. However, we may be able to find a substitute medication at lower cost or fill out paperwork to get insurance to cover a needed medication.   If a prior authorization is required to get your medication covered by your insurance company, please allow us 1-2 business days to complete this process.  Drug prices often vary depending on where the prescription is filled and some pharmacies may offer cheaper prices.  The website www.goodrx.com contains coupons for medications through different pharmacies. The prices here do not account for what the cost may be with help from insurance (it may be cheaper with your insurance), but the website can give you the price if you did not use any insurance.  - You can print the associated coupon and take it with   your prescription to the pharmacy.  - You may also stop by our office during regular business hours and pick up a GoodRx coupon card.  - If you need your prescription sent electronically to a different pharmacy, notify our office through [facility] MyChart or by phone at [phone] option 4.     Si Usted Necesita Algo Despus de Su Visita  Tambin puede enviarnos un mensaje a travs de MyChart. Por lo general respondemos a los mensajes de MyChart en el transcurso de 1 a 2  das hbiles.  Para renovar recetas, por favor pida a su farmacia que se ponga en contacto con nuestra oficina. Nuestro nmero de fax es el [phone].  Si tiene un asunto urgente cuando la clnica est cerrada y que no puede esperar hasta el siguiente da hbil, puede llamar/localizar a su doctor(a) al nmero que aparece a continuacin.   Por favor, tenga en cuenta que aunque hacemos todo lo posible para estar disponibles para asuntos urgentes fuera del horario de oficina, no estamos disponibles las 24 horas del da, los 7 das de la semana.   Si tiene un problema urgente y no puede comunicarse con nosotros, puede optar por buscar atencin mdica  en el consultorio de su doctor(a), en una clnica privada, en un centro de atencin urgente o en una sala de emergencias.  Si tiene una emergencia mdica, por favor llame inmediatamente al 911 o vaya a la sala de emergencias.  Nmeros de bper  - Dr. Kowalski: [phone]  - Dra. Moye: [phone]  - Dra. Stewart: [phone]  En caso de inclemencias del tiempo, por favor llame a nuestra lnea principal al [phone] para una actualizacin sobre el estado de cualquier retraso o cierre.  Consejos para la medicacin en dermatologa: Por favor, guarde las cajas en las que vienen los medicamentos de uso tpico para ayudarle a seguir las instrucciones sobre dnde y cmo usarlos. Las farmacias generalmente imprimen las instrucciones del medicamento slo en las cajas y no directamente en los tubos del medicamento.   Si su medicamento es muy caro, por favor, pngase en contacto con nuestra oficina llamando al [phone] y presione la opcin 4 o envenos un mensaje a travs de MyChart.   No podemos decirle cul ser su copago por los medicamentos por adelantado ya que esto es diferente dependiendo de la cobertura de su seguro. Sin embargo, es posible que podamos encontrar un medicamento sustituto a menor costo o llenar un formulario para que el  seguro cubra el medicamento que se considera necesario.   Si se requiere una autorizacin previa para que su compaa de seguros cubra su medicamento, por favor permtanos de 1 a 2 das hbiles para completar este proceso.  Los precios de los medicamentos varan con frecuencia dependiendo del lugar de dnde se surte la receta y alguna farmacias pueden ofrecer precios ms baratos.  El sitio web www.goodrx.com tiene cupones para medicamentos de diferentes farmacias. Los precios aqu no tienen en cuenta lo que podra costar con la ayuda del seguro (puede ser ms barato con su seguro), pero el sitio web puede darle el precio si no utiliz ningn seguro.  - Puede imprimir el cupn correspondiente y llevarlo con su receta a la farmacia.  - Tambin puede pasar por nuestra oficina durante el horario de atencin regular y recoger una tarjeta de cupones de GoodRx.  - Si necesita que su receta se enve electrnicamente a una farmacia diferente, informe a nuestra oficina a travs de MyChart de [facility]   o por telfono llamando al [phone] y presione la opcin 4.  

## 2022-10-25 NOTE — Progress Notes (Signed)
New Patient Visit  Subjective  Alexis Ruiz is a 61 y.o. female who presents for the following: Annual Exam. The patient presents for Total-Body Skin Exam (TBSE) for skin cancer screening and mole check.  The patient has spots, moles and lesions to be evaluated, some may be new or changing and the patient has concerns that these could be cancer.  The following portions of the chart were reviewed this encounter and updated as appropriate:   Tobacco  Allergies  Meds  Problems  Med Hx  Surg Hx  Fam Hx     Review of Systems:  No other skin or systemic complaints except as noted in HPI or Assessment and Plan.  Objective  Well appearing patient in no apparent distress; mood and affect are within normal limits.  A full examination was performed including scalp, head, eyes, ears, nose, lips, neck, chest, axillae, abdomen, back, buttocks, bilateral upper extremities, bilateral lower extremities, hands, feet, fingers, toes, fingernails, and toenails. All findings within normal limits unless otherwise noted below.  B/L eyelid Edema and erythema of the eyelids.  B/L foot Scaling and maceration web spaces and over distal and lateral soles.   B/L toenails Toenail dystropy.    Assessment & Plan  Eczematous dermatitis of eyelid, unspecified laterality B/L eyelid Vs allergic contact dermatitis - If issues persistent recommend patch testing.  Start Elidel cream to aa's QD PRN.   pimecrolimus (ELIDEL) 1 % cream - B/L eyelid Apply to eyelids BID PRN.  Rosacea Face Erythro telangiectatic type -  Rosacea is a chronic progressive skin condition usually affecting the face of adults, causing redness and/or acne bumps. It is treatable but not curable. It sometimes affects the eyes (ocular rosacea) as well. It may respond to topical and/or systemic medication and can flare with stress, sun exposure, alcohol, exercise, topical steroids (including hydrocortisone/cortisone 10) and some foods.   Daily application of broad spectrum spf 30+ sunscreen to face is recommended to reduce flares.  Discussed the treatment option of BBL/laser.  Typically we recommend 1-3 treatment sessions about 5-8 weeks apart for best results.  The patient's condition may require "maintenance treatments" in the future.  The fee for BBL / laser treatments is $350 per treatment session for the whole face.  A fee can be quoted for other parts of the body. Insurance typically does not pay for BBL/laser treatments and therefore the fee is an out-of-pocket cost.  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 Condition is symptomatic / bothersome to patient. Not to goal. Start Ketoconazole 2% cream to the feet and toenails QHS.   ketoconazole (NIZORAL) 2 % cream - B/L foot Apply to the feet and toenails QHS.  Nail problem B/L toenails Hx of trauma vs tinea unguium -  Start Ketoconazole 2% cream to feet and toenails QHS.   Lentigines - Scattered tan macules - Due to sun exposure - Benign-appearing, observe - Recommend daily broad spectrum sunscreen SPF 30+ to sun-exposed areas, reapply every 2 hours as needed. - Call for any changes  Seborrheic Keratoses - Stuck-on, waxy, tan-brown papules and/or plaques  - Benign-appearing - Discussed benign etiology and prognosis. - Observe - Call for any changes  Melanocytic Nevi - Tan-brown and/or pink-flesh-colored symmetric macules and papules - Benign appearing on exam today - Observation - Call clinic for new or changing moles - Recommend daily use of broad spectrum spf 30+ sunscreen to sun-exposed areas.  Hemangiomas - Red papules - Discussed benign nature - Observe - Call for any changes  Actinic Damage - Chronic condition, secondary to cumulative UV/sun exposure - diffuse scaly erythematous macules with underlying dyspigmentation - Recommend daily broad spectrum sunscreen SPF  30+ to sun-exposed areas, reapply every 2 hours as needed.  - Staying in the shade or wearing long sleeves, sun glasses (UVA+UVB protection) and wide brim hats (4-inch brim around the entire circumference of the hat) are also recommended for sun protection.  - Call for new or changing lesions.  Acrochordons (Skin Tags) - Fleshy, skin-colored pedunculated papules - Benign appearing.  - Observe. - If desired, they can be removed with an in office procedure that is not covered by insurance. - Please call the clinic if you notice any new or changing lesions.  Skin cancer screening performed today.  Return in about 3 months (around 01/24/2023) for rash follow up.  Luther Redo, CMA, am acting as scribe for Sarina Ser, MD . Documentation: I have reviewed the above documentation for accuracy and completeness, and I agree with the above.  Sarina Ser, MD

## 2022-11-05 ENCOUNTER — Encounter: Payer: Self-pay | Admitting: Dermatology

## 2023-01-24 ENCOUNTER — Ambulatory Visit: Payer: BC Managed Care – PPO | Admitting: Dermatology

## 2023-03-23 ENCOUNTER — Ambulatory Visit: Payer: BC Managed Care – PPO | Admitting: Dermatology

## 2023-03-23 VITALS — BP 173/101

## 2023-03-23 DIAGNOSIS — R21 Rash and other nonspecific skin eruption: Secondary | ICD-10-CM

## 2023-03-23 DIAGNOSIS — B353 Tinea pedis: Secondary | ICD-10-CM | POA: Diagnosis not present

## 2023-03-23 DIAGNOSIS — B351 Tinea unguium: Secondary | ICD-10-CM | POA: Diagnosis not present

## 2023-03-23 DIAGNOSIS — Z79899 Other long term (current) drug therapy: Secondary | ICD-10-CM | POA: Diagnosis not present

## 2023-03-23 DIAGNOSIS — Z7189 Other specified counseling: Secondary | ICD-10-CM

## 2023-03-23 MED ORDER — KETOCONAZOLE 2 % EX CREA: 1.0000 | TOPICAL_CREAM | Freq: Every day | CUTANEOUS | 11 refills | Status: DC

## 2023-03-23 NOTE — Progress Notes (Signed)
   Follow-Up Visit   Subjective  Alexis Ruiz is a 62 y.o. female who presents for the following: Tinea pedis feet, Ketoconazole 2% cr qhs, hx of Rash eyelids resolved with Elidel   The following portions of the chart were reviewed this encounter and updated as appropriate: medications, allergies, medical history  Review of Systems:  No other skin or systemic complaints except as noted in HPI or Assessment and Plan.  Objective  Well appearing patient in no apparent distress; mood and affect are within normal limits.  .  A focused examination was performed of the following areas: feet  Relevant exam findings are noted in the Assessment and Plan.    Assessment & Plan   Rash eyelids Exam: Eyelids clear today  Differential diagnosis:  Eczematous dermatitis vs Allergic contact dermatitis  Treatment Plan: Cont Elidel cr prn flares  Discussed if pt continues to flare, would recommend TRUE Patch Test 36   TINEA PEDIS / Tinea UNGUIUM Exam: toenail dystrophy, mild scale feet  Chronic and persistent condition with duration or expected duration over one year. Condition is symptomatic/ bothersome to patient. Not currently at goal.   Treatment Plan: Cont Ketoconazole 2% cr qhs to feet/nails Discussed topical treatment, pt declines  Return if symptoms worsen or fail to improve.  I, Ardis Rowan, RMA, am acting as scribe for Armida Sans, MD .   Documentation: I have reviewed the above documentation for accuracy and completeness, and I agree with the above.  Armida Sans, MD

## 2023-03-23 NOTE — Patient Instructions (Signed)
Due to recent changes in healthcare laws, you may see results of your pathology and/or laboratory studies on MyChart before the doctors have had a chance to review them. We understand that in some cases there may be results that are confusing or concerning to you. Please understand that not all results are received at the same time and often the doctors may need to interpret multiple results in order to provide you with the best plan of care or course of treatment. Therefore, we ask that you please give us 2 business days to thoroughly review all your results before contacting the office for clarification. Should we see a critical lab result, you will be contacted sooner.   If You Need Anything After Your Visit  If you have any questions or concerns for your doctor, please call our main line at [phone] and press option 4 to reach your doctor's medical assistant. If no one answers, please leave a voicemail as directed and we will return your call as soon as possible. Messages left after 4 pm will be answered the following business day.   You may also send us a message via MyChart. We typically respond to MyChart messages within 1-2 business days.  For prescription refills, please ask your pharmacy to contact our office. Our fax number is [phone].  If you have an urgent issue when the clinic is closed that cannot wait until the next business day, you can page your doctor at the number below.    Please note that while we do our best to be available for urgent issues outside of office hours, we are not available 24/7.   If you have an urgent issue and are unable to reach us, you may choose to seek medical care at your doctor's office, retail clinic, urgent care center, or emergency room.  If you have a medical emergency, please immediately call 911 or go to the emergency department.  Pager Numbers  - Dr. Kowalski: [phone]  - Dr. Moye: [phone]  - Dr. Stewart:  [phone]  In the event of inclement weather, please call our main line at [phone] for an update on the status of any delays or closures.  Dermatology Medication Tips: Please keep the boxes that topical medications come in in order to help keep track of the instructions about where and how to use these. Pharmacies typically print the medication instructions only on the boxes and not directly on the medication tubes.   If your medication is too expensive, please contact our office at [phone] option 4 or send us a message through MyChart.   We are unable to tell what your co-pay for medications will be in advance as this is different depending on your insurance coverage. However, we may be able to find a substitute medication at lower cost or fill out paperwork to get insurance to cover a needed medication.   If a prior authorization is required to get your medication covered by your insurance company, please allow us 1-2 business days to complete this process.  Drug prices often vary depending on where the prescription is filled and some pharmacies may offer cheaper prices.  The website www.goodrx.com contains coupons for medications through different pharmacies. The prices here do not account for what the cost may be with help from insurance (it may be cheaper with your insurance), but the website can give you the price if you did not use any insurance.  - You can print the associated coupon and take it with   your prescription to the pharmacy.  - You may also stop by our office during regular business hours and pick up a GoodRx coupon card.  - If you need your prescription sent electronically to a different pharmacy, notify our office through [facility] MyChart or by phone at [phone] option 4.     Si Usted Necesita Algo Despus de Su Visita  Tambin puede enviarnos un mensaje a travs de MyChart. Por lo general respondemos a los mensajes de MyChart en el transcurso de 1 a 2  das hbiles.  Para renovar recetas, por favor pida a su farmacia que se ponga en contacto con nuestra oficina. Nuestro nmero de fax es el [phone].  Si tiene un asunto urgente cuando la clnica est cerrada y que no puede esperar hasta el siguiente da hbil, puede llamar/localizar a su doctor(a) al nmero que aparece a continuacin.   Por favor, tenga en cuenta que aunque hacemos todo lo posible para estar disponibles para asuntos urgentes fuera del horario de oficina, no estamos disponibles las 24 horas del da, los 7 das de la semana.   Si tiene un problema urgente y no puede comunicarse con nosotros, puede optar por buscar atencin mdica  en el consultorio de su doctor(a), en una clnica privada, en un centro de atencin urgente o en una sala de emergencias.  Si tiene una emergencia mdica, por favor llame inmediatamente al 911 o vaya a la sala de emergencias.  Nmeros de bper  - Dr. Kowalski: [phone]  - Dra. Moye: [phone]  - Dra. Stewart: [phone]  En caso de inclemencias del tiempo, por favor llame a nuestra lnea principal al [phone] para una actualizacin sobre el estado de cualquier retraso o cierre.  Consejos para la medicacin en dermatologa: Por favor, guarde las cajas en las que vienen los medicamentos de uso tpico para ayudarle a seguir las instrucciones sobre dnde y cmo usarlos. Las farmacias generalmente imprimen las instrucciones del medicamento slo en las cajas y no directamente en los tubos del medicamento.   Si su medicamento es muy caro, por favor, pngase en contacto con nuestra oficina llamando al [phone] y presione la opcin 4 o envenos un mensaje a travs de MyChart.   No podemos decirle cul ser su copago por los medicamentos por adelantado ya que esto es diferente dependiendo de la cobertura de su seguro. Sin embargo, es posible que podamos encontrar un medicamento sustituto a menor costo o llenar un formulario para que el  seguro cubra el medicamento que se considera necesario.   Si se requiere una autorizacin previa para que su compaa de seguros cubra su medicamento, por favor permtanos de 1 a 2 das hbiles para completar este proceso.  Los precios de los medicamentos varan con frecuencia dependiendo del lugar de dnde se surte la receta y alguna farmacias pueden ofrecer precios ms baratos.  El sitio web www.goodrx.com tiene cupones para medicamentos de diferentes farmacias. Los precios aqu no tienen en cuenta lo que podra costar con la ayuda del seguro (puede ser ms barato con su seguro), pero el sitio web puede darle el precio si no utiliz ningn seguro.  - Puede imprimir el cupn correspondiente y llevarlo con su receta a la farmacia.  - Tambin puede pasar por nuestra oficina durante el horario de atencin regular y recoger una tarjeta de cupones de GoodRx.  - Si necesita que su receta se enve electrnicamente a una farmacia diferente, informe a nuestra oficina a travs de MyChart de [facility]   o por telfono llamando al [phone] y presione la opcin 4.  

## 2023-03-30 ENCOUNTER — Encounter: Payer: Self-pay | Admitting: Dermatology

## 2023-04-25 DIAGNOSIS — R7303 Prediabetes: Secondary | ICD-10-CM | POA: Insufficient documentation

## 2023-09-28 ENCOUNTER — Other Ambulatory Visit: Payer: Self-pay

## 2023-09-28 MED ORDER — KETOCONAZOLE 2 % EX CREA: 1.0000 | TOPICAL_CREAM | Freq: Every day | CUTANEOUS | 11 refills | Status: AC

## 2024-07-19 ENCOUNTER — Encounter: Payer: Self-pay | Admitting: Dermatology

## 2024-07-19 ENCOUNTER — Ambulatory Visit: Admitting: Dermatology

## 2024-07-19 DIAGNOSIS — L219 Seborrheic dermatitis, unspecified: Secondary | ICD-10-CM | POA: Diagnosis not present

## 2024-07-19 NOTE — Patient Instructions (Signed)

## 2024-07-19 NOTE — Progress Notes (Signed)
   Follow-Up Visit   Subjective  Alexis Ruiz is a 63 y.o. female who presents for the following: Pt was seen by Elsie Brewster, PA in rheumatology who would like her to be evaluated to r/o psoriasis in the ears and PsA. Pt c/o occasional itch inside the ears. Fhx of psoriasis in daughter which pt states is severe. Pt has joint pain/swelling of joints in hands and was prescribed sulfasalazine.   The following portions of the chart were reviewed this encounter and updated as appropriate: medications, allergies, medical history  Review of Systems:  No other skin or systemic complaints except as noted in HPI or Assessment and Plan.  Objective  Well appearing patient in no apparent distress; mood and affect are within normal limits.   A focused examination was performed of the following areas: the face, ears, back, abdomen, and scalp   Relevant exam findings are noted in the Assessment and Plan.    Assessment & Plan   SEBORRHEIC DERMATITIS, not consistent with psoriasis  Exam: slight scale of the external acoustic meatuses, erythematous scaly patches alar creases and nasolabial folds. No lesions on umbilicus elbows knees or fingernails. Patient reports no lesions on feet  Chronic and persistent condition with duration or expected duration over one year. Condition is symptomatic/ bothersome to patient. Not currently at goal.  Seborrheic Dermatitis is a chronic persistent rash characterized by pinkness and scaling most commonly of the mid face but also can occur on the scalp (dandruff), ears; mid chest, mid back and groin.  It tends to be exacerbated by stress and cooler weather.  People who have neurologic disease may experience new onset or exacerbation of existing seborrheic dermatitis.  The condition is not curable but treatable and can be controlled.  Treatment Plan: Not bothersome to pt, no tx needed.  SEBORRHEIC DERMATITIS    Return if symptoms worsen or fail to  improve.  Alexis Ruiz, CMA, am acting as scribe for Boneta Sharps, MD .  Documentation: I have reviewed the above documentation for accuracy and completeness, and I agree with the above.  Boneta Sharps, MD
